# Patient Record
Sex: Male | Born: 1955 | Race: White | Hispanic: No | Marital: Single | Smoking: Current every day smoker
Health system: Southern US, Community
[De-identification: ages and names within clinical notes are randomized; demographics above are authoritative.]

## PROBLEM LIST (undated history)

## (undated) DIAGNOSIS — F1010 Alcohol abuse, uncomplicated: Secondary | ICD-10-CM

## (undated) DIAGNOSIS — Z720 Tobacco use: Secondary | ICD-10-CM

## (undated) DIAGNOSIS — F32A Depression, unspecified: Secondary | ICD-10-CM

---

## 2020-10-05 ENCOUNTER — Other Ambulatory Visit
Admission: RE | Admit: 2020-10-05 | Discharge: 2020-10-05 | Disposition: A | Discharge status: Home / Self Care | Payer: Self-pay | Source: Ambulatory Visit | Attending: Infectious Diseases | Admitting: Infectious Diseases

## 2020-10-05 DIAGNOSIS — M79671 Pain in right foot: Secondary | ICD-10-CM | POA: Insufficient documentation

## 2020-10-05 DIAGNOSIS — R600 Localized edema: Secondary | ICD-10-CM | POA: Insufficient documentation

## 2020-10-05 DIAGNOSIS — F32A Depression, unspecified: Secondary | ICD-10-CM | POA: Insufficient documentation

## 2020-10-05 DIAGNOSIS — R030 Elevated blood-pressure reading, without diagnosis of hypertension: Secondary | ICD-10-CM | POA: Insufficient documentation

## 2020-10-05 DIAGNOSIS — G8929 Other chronic pain: Secondary | ICD-10-CM | POA: Insufficient documentation

## 2020-10-05 LAB — BRAIN NATRIURETIC PEPTIDE: B Natriuretic Peptide: 95.3 pg/mL (ref 0.0–100.0)

## 2020-10-19 NOTE — Congregational Nurse Program (Signed)
  Dept: [phone]   Congregational Nurse Program Note  Date of Encounter: 10/19/2020 Client to clinic today for assistance with medical appointments. He now has a phone, phone number updated with [facility]. New appointment made today with podiatrist Dr. Alberteen Spindle for Wednesday Sept.28 at 10:15. Medicaid Transportation set up for this appointment, information given to client on a card. Support given. Past Medical History: No past medical history on file.  Encounter Details:

## 2021-03-22 ENCOUNTER — Encounter (HOSPITAL_COMMUNITY): Payer: Self-pay | Admitting: Physician Assistant

## 2021-03-22 ENCOUNTER — Other Ambulatory Visit: Payer: Self-pay

## 2021-03-22 ENCOUNTER — Emergency Department (HOSPITAL_COMMUNITY): Payer: Medicare Other

## 2021-03-22 ENCOUNTER — Inpatient Hospital Stay (HOSPITAL_COMMUNITY)
Admission: EM | Admit: 2021-03-22 | Discharge: 2021-03-27 | DRG: 189 | Disposition: A | Discharge status: Home / Self Care | Payer: Medicare Other | Attending: Internal Medicine | Admitting: Internal Medicine

## 2021-03-22 DIAGNOSIS — D72829 Elevated white blood cell count, unspecified: Secondary | ICD-10-CM

## 2021-03-22 DIAGNOSIS — I4892 Unspecified atrial flutter: Secondary | ICD-10-CM

## 2021-03-22 DIAGNOSIS — Z77098 Contact with and (suspected) exposure to other hazardous, chiefly nonmedicinal, chemicals: Secondary | ICD-10-CM | POA: Diagnosis present

## 2021-03-22 DIAGNOSIS — R6510 Systemic inflammatory response syndrome (SIRS) of non-infectious origin without acute organ dysfunction: Secondary | ICD-10-CM

## 2021-03-22 DIAGNOSIS — F32A Depression, unspecified: Secondary | ICD-10-CM | POA: Diagnosis present

## 2021-03-22 DIAGNOSIS — Z5900 Homelessness unspecified: Secondary | ICD-10-CM

## 2021-03-22 DIAGNOSIS — E872 Acidosis, unspecified: Secondary | ICD-10-CM | POA: Diagnosis present

## 2021-03-22 DIAGNOSIS — J209 Acute bronchitis, unspecified: Secondary | ICD-10-CM | POA: Insufficient documentation

## 2021-03-22 DIAGNOSIS — D649 Anemia, unspecified: Secondary | ICD-10-CM | POA: Diagnosis present

## 2021-03-22 DIAGNOSIS — R778 Other specified abnormalities of plasma proteins: Secondary | ICD-10-CM

## 2021-03-22 DIAGNOSIS — F1010 Alcohol abuse, uncomplicated: Secondary | ICD-10-CM

## 2021-03-22 DIAGNOSIS — D75839 Thrombocytosis, unspecified: Secondary | ICD-10-CM | POA: Diagnosis present

## 2021-03-22 DIAGNOSIS — Z885 Allergy status to narcotic agent status: Secondary | ICD-10-CM

## 2021-03-22 DIAGNOSIS — J9601 Acute respiratory failure with hypoxia: Principal | ICD-10-CM

## 2021-03-22 DIAGNOSIS — Z720 Tobacco use: Secondary | ICD-10-CM

## 2021-03-22 DIAGNOSIS — J441 Chronic obstructive pulmonary disease with (acute) exacerbation: Secondary | ICD-10-CM | POA: Diagnosis present

## 2021-03-22 DIAGNOSIS — G8929 Other chronic pain: Secondary | ICD-10-CM | POA: Diagnosis present

## 2021-03-22 DIAGNOSIS — Z20822 Contact with and (suspected) exposure to covid-19: Secondary | ICD-10-CM | POA: Diagnosis present

## 2021-03-22 DIAGNOSIS — R7989 Other specified abnormal findings of blood chemistry: Secondary | ICD-10-CM

## 2021-03-22 DIAGNOSIS — I483 Typical atrial flutter: Secondary | ICD-10-CM | POA: Diagnosis present

## 2021-03-22 DIAGNOSIS — I4510 Unspecified right bundle-branch block: Secondary | ICD-10-CM | POA: Diagnosis present

## 2021-03-22 DIAGNOSIS — Z8249 Family history of ischemic heart disease and other diseases of the circulatory system: Secondary | ICD-10-CM

## 2021-03-22 DIAGNOSIS — F17210 Nicotine dependence, cigarettes, uncomplicated: Secondary | ICD-10-CM | POA: Diagnosis present

## 2021-03-22 DIAGNOSIS — I959 Hypotension, unspecified: Secondary | ICD-10-CM | POA: Diagnosis present

## 2021-03-22 DIAGNOSIS — F1410 Cocaine abuse, uncomplicated: Secondary | ICD-10-CM | POA: Diagnosis present

## 2021-03-22 DIAGNOSIS — R0602 Shortness of breath: Secondary | ICD-10-CM

## 2021-03-22 DIAGNOSIS — Z716 Tobacco abuse counseling: Secondary | ICD-10-CM

## 2021-03-22 DIAGNOSIS — A419 Sepsis, unspecified organism: Secondary | ICD-10-CM

## 2021-03-22 HISTORY — DX: Depression, unspecified: F32.A

## 2021-03-22 HISTORY — DX: Tobacco use: Z72.0

## 2021-03-22 HISTORY — DX: Alcohol abuse, uncomplicated: F10.10

## 2021-03-22 LAB — PROTIME-INR
INR: 1.1 (ref 0.8–1.2)
Prothrombin Time: 13.9 seconds (ref 11.4–15.2)

## 2021-03-22 LAB — URINALYSIS, ROUTINE W REFLEX MICROSCOPIC
Bilirubin Urine: NEGATIVE
Glucose, UA: NEGATIVE mg/dL
Hgb urine dipstick: NEGATIVE
Ketones, ur: 5 mg/dL — AB
Leukocytes,Ua: NEGATIVE
Nitrite: NEGATIVE
Protein, ur: NEGATIVE mg/dL
Specific Gravity, Urine: 1.01 (ref 1.005–1.030)
pH: 5 (ref 5.0–8.0)

## 2021-03-22 LAB — CBC WITH DIFFERENTIAL/PLATELET
Abs Immature Granulocytes: 0.15 10*3/uL — ABNORMAL HIGH (ref 0.00–0.07)
Basophils Absolute: 0.1 10*3/uL (ref 0.0–0.1)
Basophils Relative: 0 %
Eosinophils Absolute: 0 10*3/uL (ref 0.0–0.5)
Eosinophils Relative: 0 %
HCT: 51.5 % (ref 39.0–52.0)
Hemoglobin: 17.4 g/dL — ABNORMAL HIGH (ref 13.0–17.0)
Immature Granulocytes: 1 %
Lymphocytes Relative: 12 %
Lymphs Abs: 2.6 10*3/uL (ref 0.7–4.0)
MCH: 33.8 pg (ref 26.0–34.0)
MCHC: 33.8 g/dL (ref 30.0–36.0)
MCV: 100 fL (ref 80.0–100.0)
Monocytes Absolute: 1.5 10*3/uL — ABNORMAL HIGH (ref 0.1–1.0)
Monocytes Relative: 7 %
Neutro Abs: 18.1 10*3/uL — ABNORMAL HIGH (ref 1.7–7.7)
Neutrophils Relative %: 80 %
Platelets: 356 10*3/uL (ref 150–400)
RBC: 5.15 MIL/uL (ref 4.22–5.81)
RDW: 12.5 % (ref 11.5–15.5)
WBC: 22.4 10*3/uL — ABNORMAL HIGH (ref 4.0–10.5)
nRBC: 0 % (ref 0.0–0.2)

## 2021-03-22 LAB — COMPREHENSIVE METABOLIC PANEL
ALT: 22 U/L (ref 0–44)
AST: 26 U/L (ref 15–41)
Albumin: 3.4 g/dL — ABNORMAL LOW (ref 3.5–5.0)
Alkaline Phosphatase: 75 U/L (ref 38–126)
Anion gap: 13 (ref 5–15)
BUN: 17 mg/dL (ref 8–23)
CO2: 23 mmol/L (ref 22–32)
Calcium: 9 mg/dL (ref 8.9–10.3)
Chloride: 101 mmol/L (ref 98–111)
Creatinine, Ser: 1.18 mg/dL (ref 0.61–1.24)
GFR, Estimated: >60 mL/min (ref >60)
Glucose, Bld: 114 mg/dL — ABNORMAL HIGH (ref 70–99)
Potassium: 3.8 mmol/L (ref 3.5–5.1)
Sodium: 137 mmol/L (ref 135–145)
Total Bilirubin: 1.5 mg/dL — ABNORMAL HIGH (ref 0.3–1.2)
Total Protein: 6.8 g/dL (ref 6.5–8.1)

## 2021-03-22 LAB — LACTIC ACID, PLASMA
Lactic Acid, Venous: 2.3 mmol/L (ref 0.5–1.9)
Lactic Acid, Venous: 1.8 mmol/L (ref 0.5–1.9)

## 2021-03-22 LAB — RESP PANEL BY RT-PCR (FLU A&B, COVID) ARPGX2
Influenza A by PCR: NEGATIVE
Influenza B by PCR: NEGATIVE
SARS Coronavirus 2 by RT PCR: NEGATIVE

## 2021-03-22 LAB — HIV ANTIBODY (ROUTINE TESTING W REFLEX): HIV Screen 4th Generation wRfx: NONREACTIVE

## 2021-03-22 LAB — ETHANOL: Alcohol, Ethyl (B): <10 mg/dL (ref <10)

## 2021-03-22 LAB — TROPONIN I (HIGH SENSITIVITY)
Troponin I (High Sensitivity): 18 ng/L — ABNORMAL HIGH (ref <18)
Troponin I (High Sensitivity): 13 ng/L (ref <18)

## 2021-03-22 LAB — RAPID URINE DRUG SCREEN, HOSP PERFORMED
Amphetamines: NOT DETECTED
Barbiturates: NOT DETECTED
Benzodiazepines: NOT DETECTED
Cocaine: POSITIVE — AB
Opiates: NOT DETECTED
Tetrahydrocannabinol: NOT DETECTED

## 2021-03-22 LAB — MAGNESIUM: Magnesium: 2 mg/dL (ref 1.7–2.4)

## 2021-03-22 LAB — I-STAT CHEM 8, ED
BUN: 22 mg/dL (ref 8–23)
Calcium, Ion: 1.11 mmol/L — ABNORMAL LOW (ref 1.15–1.40)
Chloride: 102 mmol/L (ref 98–111)
Creatinine, Ser: 1.2 mg/dL (ref 0.61–1.24)
Glucose, Bld: 114 mg/dL — ABNORMAL HIGH (ref 70–99)
HCT: 53 % — ABNORMAL HIGH (ref 39.0–52.0)
Hemoglobin: 18 g/dL — ABNORMAL HIGH (ref 13.0–17.0)
Potassium: 4.1 mmol/L (ref 3.5–5.1)
Sodium: 137 mmol/L (ref 135–145)
TCO2: 26 mmol/L (ref 22–32)

## 2021-03-22 LAB — APTT: aPTT: 29 seconds (ref 24–36)

## 2021-03-22 LAB — PROCALCITONIN: Procalcitonin: 0.15 ng/mL

## 2021-03-22 LAB — TSH: TSH: 0.413 u[IU]/mL (ref 0.350–4.500)

## 2021-03-22 LAB — BRAIN NATRIURETIC PEPTIDE: B Natriuretic Peptide: 242.2 pg/mL — ABNORMAL HIGH (ref 0.0–100.0)

## 2021-03-22 LAB — LIPASE, BLOOD: Lipase: 33 U/L (ref 11–51)

## 2021-03-22 MED ORDER — SODIUM CHLORIDE 0.9 % IV SOLN
2.0000 g | INTRAVENOUS | Status: AC
  Administered 2021-03-22 – 2021-03-26 (×5): 2 g via INTRAVENOUS
  Filled 2021-03-22 (×5): qty 20

## 2021-03-22 MED ORDER — AMIODARONE HCL IN DEXTROSE 360-4.14 MG/200ML-% IV SOLN
30.0000 mg/h | INTRAVENOUS | Status: DC
  Administered 2021-03-23 (×3): 30 mg/h via INTRAVENOUS
  Filled 2021-03-22 (×3): qty 200

## 2021-03-22 MED ORDER — AMIODARONE HCL IN DEXTROSE 360-4.14 MG/200ML-% IV SOLN
60.0000 mg/h | INTRAVENOUS | Status: AC
  Administered 2021-03-22 (×2): 60 mg/h via INTRAVENOUS
  Filled 2021-03-22 (×2): qty 200

## 2021-03-22 MED ORDER — SODIUM CHLORIDE 0.9 % IV SOLN
500.0000 mg | INTRAVENOUS | Status: DC
  Filled 2021-03-22: qty 5

## 2021-03-22 MED ORDER — BUDESONIDE 0.5 MG/2ML IN SUSP
0.5000 mg | Freq: Two times a day (BID) | RESPIRATORY_TRACT | Status: DC
  Administered 2021-03-23 – 2021-03-26 (×8): 0.5 mg via RESPIRATORY_TRACT
  Filled 2021-03-22 (×9): qty 2

## 2021-03-22 MED ORDER — DOXYCYCLINE HYCLATE 100 MG PO TABS
100.0000 mg | ORAL_TABLET | Freq: Two times a day (BID) | ORAL | Status: DC
Start: 2021-03-22 — End: 2021-03-27
  Administered 2021-03-22 – 2021-03-27 (×10): 100 mg via ORAL
  Filled 2021-03-22 (×10): qty 1

## 2021-03-22 MED ORDER — LACTATED RINGERS IV SOLN: INTRAVENOUS | Status: AC

## 2021-03-22 MED ORDER — HEPARIN BOLUS VIA INFUSION
4000.0000 [IU] | Freq: Once | INTRAVENOUS | Status: AC
  Administered 2021-03-22: 4000 [IU] via INTRAVENOUS
  Filled 2021-03-22: qty 4000

## 2021-03-22 MED ORDER — DOXYCYCLINE HYCLATE 100 MG PO TABS
100.0000 mg | ORAL_TABLET | Freq: Once | ORAL | Status: DC
Start: 2021-03-22 — End: 2021-03-22

## 2021-03-22 MED ORDER — AMIODARONE LOAD VIA INFUSION
150.0000 mg | Freq: Once | INTRAVENOUS | Status: AC
  Administered 2021-03-22: 150 mg via INTRAVENOUS
  Filled 2021-03-22: qty 83.34

## 2021-03-22 MED ORDER — SODIUM CHLORIDE 0.9 % IV SOLN: 2.0000 g | INTRAVENOUS | Status: DC

## 2021-03-22 MED ORDER — IPRATROPIUM BROMIDE HFA 17 MCG/ACT IN AERS: 2.0000 | INHALATION_SPRAY | RESPIRATORY_TRACT | Status: DC

## 2021-03-22 MED ORDER — ACETAMINOPHEN 325 MG PO TABS
650.0000 mg | ORAL_TABLET | ORAL | Status: DC | PRN
  Administered 2021-03-23 – 2021-03-26 (×5): 650 mg via ORAL
  Filled 2021-03-22 (×5): qty 2

## 2021-03-22 MED ORDER — ONDANSETRON HCL 4 MG/2ML IJ SOLN: 4.0000 mg | Freq: Four times a day (QID) | INTRAMUSCULAR | Status: DC | PRN

## 2021-03-22 MED ORDER — HEPARIN (PORCINE) 25000 UT/250ML-% IV SOLN
1800.0000 [IU]/h | INTRAVENOUS | Status: AC
  Administered 2021-03-22: 21:00:00 1200 [IU]/h via INTRAVENOUS
  Administered 2021-03-23: 1400 [IU]/h via INTRAVENOUS
  Administered 2021-03-24: 1800 [IU]/h via INTRAVENOUS
  Filled 2021-03-22 (×3): qty 250

## 2021-03-22 MED ORDER — IPRATROPIUM BROMIDE 0.02 % IN SOLN
0.5000 mg | RESPIRATORY_TRACT | Status: DC
  Administered 2021-03-22 – 2021-03-23 (×2): 0.5 mg via RESPIRATORY_TRACT
  Filled 2021-03-22 (×3): qty 2.5

## 2021-03-22 MED ORDER — LACTATED RINGERS IV BOLUS
1000.0000 mL | Freq: Once | INTRAVENOUS | Status: AC
  Administered 2021-03-22: 1000 mL via INTRAVENOUS

## 2021-03-22 MED ORDER — LACTATED RINGERS IV BOLUS (SEPSIS)
500.0000 mL | Freq: Once | INTRAVENOUS | Status: AC
  Administered 2021-03-22: 500 mL via INTRAVENOUS
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
## 2021-03-22 NOTE — Consult Note (Signed)
NAME:  Jack Barber, MRN:  [id number], DOB:  21-Jun-1955, LOS: 0 ADMISSION DATE:  03/22/2021, CONSULTATION DATE: March 22, 2021 REFERRING MD: ED staff, CHIEF COMPLAINT: Shortness of breath patient is 66 year old male with history of alcohol abuse smoking does not use oxygen who is here with 2 to 3 days history of shortness of breath came and was found to have A-fib RVR a flutter was put on amnio drip he was hypotensive initially when I am talking to him he is telling me that his breathing is much better now since the treatment started we were called to assess for an ICU admission patient is denying any chest pain and using the oxygen in the past now he is on 6 L.   Objective   Blood pressure 117/81, pulse (!) 144, temperature 98.2 F (36.8 C), temperature source Oral, resp. rate (!) 32, height 5\' 8"  (1.727 m), weight 81.6 kg, SpO2 94 %.       No intake or output data in the 24 hours ending 03/22/21 1955 Filed Weights   03/22/21 1649  Weight: 81.6 kg    Examination: General:  NAD , pleasant and hungry , wants to eat . Neuro:  WNL , AOX3 , EOMI , CN II-XII intact , UL , LL strength is symmetrical and 5/5 HEENT:  atraumatic , no jaundice , dry mucous membranes  Cardiovascular:  Irregular irregular , ESM 2/6 in the aortic area  Lungs:  CTA bilateral , no wheezing or crackles  Abdomen:  Soft lax +BS , no tenderness . Musculoskeletal:  WNL , normal pulses  Skin:  No rash     Assessment & Plan:    --Shortness of breath multifactorial history of smoking possible COPD acute bronchitis and a flutter with RVR start rate control patient blood pressure improved with that we will add DuoNebs and Pulmicort or Atrovent and Pulmicort avoid beta 2 agonist with the severe tachycardia  -- Hypotension with lactic acid possible septic possibly from the heart rate he is started on antibiotics continue with that till cultures are negative check procalcitonin   --Empiric treatment for alcohol  withdrawal nicotine patches for smoking patient is somewhat stable at this point no hypotensive no indication for pressors he is on amnio drip we will monitor but no clear indication to admit him to the ICU.  --Thrombocytosis could be from secondary hypoxemia or smoking or dehydration continue with gentle hydration.  Labs   CBC: Recent Labs  Lab 03/22/21 1717 03/22/21 1732  WBC 22.4*  --   NEUTROABS 18.1*  --   HGB 17.4* 18.0*  HCT 51.5 53.0*  MCV 100.0  --   PLT 356  --     Basic Metabolic Panel: Recent Labs  Lab 03/22/21 1717 03/22/21 1732  NA 137 137  K 3.8 4.1  CL 101 102  CO2 23  --   GLUCOSE 114* 114*  BUN 17 22  CREATININE 1.18 1.20  CALCIUM 9.0  --   MG 2.0  --    GFR: Estimated Creatinine Clearance: 58.6 mL/min (by C-G formula based on SCr of 1.2 mg/dL). Recent Labs  Lab 03/22/21 1717  WBC 22.4*  LATICACIDVEN 2.3*    Liver Function Tests: Recent Labs  Lab 03/22/21 1717  AST 26  ALT 22  ALKPHOS 75  BILITOT 1.5*  PROT 6.8  ALBUMIN 3.4*   Recent Labs  Lab 03/22/21 1717  LIPASE 33   No results for input(s): AMMONIA in the last 168 hours.  ABG    Component Value Date/Time   TCO2 26 03/22/2021 1732     Coagulation Profile: Recent Labs  Lab 03/22/21 1717  INR 1.1    Cardiac Enzymes: No results for input(s): CKTOTAL, CKMB, CKMBINDEX, TROPONINI in the last 168 hours.  HbA1C: No results found for: HGBA1C  CBG: No results for input(s): GLUCAP in the last 168 hours.  Review of Systems:   What mentioned HPI patient had no headache no passing out  Past Medical History:  He,  has a past medical history of Alcohol abuse and Tobacco abuse.   Surgical History:   Social History:   reports that he has been smoking cigarettes. He does not have any smokeless tobacco history on file. He reports current alcohol use.   Family History:  His family history includes Heart attack in his brother and father.   Allergies Allergies  Allergen  Reactions   Codeine Itching     Home Medications  Prior to Admission medications   Not on File
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
## 2021-03-22 NOTE — ED Triage Notes (Signed)
Pt bib [facility] from home c/o cough, fever, chills, and SOB x4 days. Pt states he feels like hes drowning when he lays down. Pt has no hx and meds because he "does not believe in that nor can I afford to believe in that." EMS gave 1 albuterol neb, 125mg  of solumedrol, and 1L of NS. Pt has not drank alcohol in 4 days. Pt is also a smoker.   EMS vitals 104/60 BP 140-150 HR 95% 6L [place] 28 RR 176 CBG

## 2021-03-22 NOTE — Consult Note (Addendum)
Cardiology Consultation:   Patient ID: Jack Barber MRN: [id number]; DOB: 1955/04/17  Admit date: 03/22/2021 Date of Consult: 03/22/2021  PCP:  No primary care provider on file.   [facility] Providers Cardiologist:  New   Patient Profile:   Jack Barber is a 66 y.o. male with a hx of tobacco and alcohol abuse who is being seen 03/22/2021 for the evaluation of atrial flutter at the request of Dr. Denton Lank.  History of Present Illness:   Mr. Mazariego has no regular follow-up or checkup.  He does not take care of himself.  No work since pandemic.  He smokes 1 pack of tobacco for past 21 years.  Drinks alcohol few days per week.  Denies illicit drug use.  Reports history of CAD to brother and father in their 82s.  He reported 4 to 5 days history of "fluid in his chest".  Hot and cold feeling.  He was short of breath but no chest pain.  Denies lower extremity edema, palpitation or syncope.  Had dizziness with coughing spell.  Due to worsening breathing EMS was called and given 125 mg of Solu-Medrol, nebulizer and 1 L of normal saline.  He was noted to have atrial flutter with RVR.  Plan to start IV amiodarone.  Cardiology is asked for further evaluation.  Oxygenation 95% on 6 L nasal cannula.  Labs are pending.   Past Medical History:  Diagnosis Date   Alcohol abuse    Tobacco abuse      Inpatient Medications: Scheduled Meds:  Continuous Infusions:  amiodarone     Followed by   amiodarone     cefTRIAXone (ROCEPHIN)  IV 2 g (03/22/21 1751)   lactated ringers 500 mL (03/22/21 1751)   lactated ringers     PRN Meds:   Allergies:    Allergies  Allergen Reactions   Codeine Itching    Social History:   Social History   Socioeconomic History   Marital status: Single    Spouse name: Not on file   Number of children: Not on file   Years of education: Not on file   Highest education level: Not on file  Occupational History   Not on file  Tobacco Use    Smoking status: Every Day    Types: Cigarettes   Smokeless tobacco: Not on file  Substance and Sexual Activity   Alcohol use: Yes   Drug use: Not on file   Sexual activity: Not on file  Other Topics Concern   Not on file  Social History Narrative   Not on file   Social Determinants of Health   Financial Resource Strain: Not on file  Food Insecurity: Not on file  Transportation Needs: Not on file  Physical Activity: Not on file  Stress: Not on file  Social Connections: Not on file  Intimate Partner Violence: Not on file    Family History:   Family History  Problem Relation Age of Onset   Heart attack Father    Heart attack Brother      ROS:  Please see the history of present illness.  All other ROS reviewed and negative.     Physical Exam/Data:   Vitals:   03/22/21 1645 03/22/21 1649 03/22/21 1700 03/22/21 1745  BP: (!) 105/94  100/89 101/86  Pulse: (!) 156  75 (!) 155  Resp: (!) 35  (!) 40 (!) 28  Temp:      TempSrc:      SpO2: 96%  96% 95%  Weight:  81.6 kg    Height:  5\' 8"  (1.727 m)     No intake or output data in the 24 hours ending 03/22/21 1801 Last 3 Weights 03/22/2021  Weight (lbs) 180 lb  Weight (kg) 81.647 kg     Body mass index is 27.37 kg/m.  General: Ill-appearing male in no acute distress  HEENT: normal Neck: JVD difficult to assess due to beard Vascular: No carotid bruits; Distal pulses 2+ bilaterally Cardiac:  normal S1, S2; RRR; no murmur  Lungs:  clear to auscultation bilaterally, no wheezing, rhonchi or rales  Abd: soft, nontender, no hepatomegaly  Ext: no edema Musculoskeletal:  No deformities, BUE and BLE strength normal and equal Skin: warm and dry  Neuro:  CNs 2-12 intact, no focal abnormalities noted Psych:  Normal affect   EKG:  The EKG was personally reviewed and demonstrates: 2:1 atrial flutter Telemetry:  Telemetry was personally reviewed and demonstrates: Atrial flutter with heart rate in 150s  Relevant CV  Studies: N/A  Laboratory Data:  High Sensitivity Troponin:  No results for input(s): TROPONINIHS in the last 720 hours.   Chemistry Recent Labs  Lab 03/22/21 1732  NA 137  K 4.1  CL 102  GLUCOSE 114*  BUN 22  CREATININE 1.20     Hematology Recent Labs  Lab 03/22/21 1717 03/22/21 1732  WBC 22.4*  --   RBC 5.15  --   HGB 17.4* 18.0*  HCT 51.5 53.0*  MCV 100.0  --   MCH 33.8  --   MCHC 33.8  --   RDW 12.5  --   PLT 356  --    Radiology/Studies:  DG Chest Port 1 View  Result Date: 03/22/2021 CLINICAL DATA:  Short of breath EXAM: PORTABLE CHEST 1 VIEW COMPARISON:  None. FINDINGS: The heart size and mediastinal contours are within normal limits. Both lungs are clear. The visualized skeletal structures are unremarkable. IMPRESSION: No active disease. Electronically Signed   By: 03/24/2021 M.D.   On: 03/22/2021 17:19     Assessment and Plan:   New onset atrial flutter with rapid ventricular rate -In setting of suspected sepsis of unknown origin.  Labs are pending. -Agree with IV amiodarone -Get echocardiogram -03/24/2021 Vascor of 1 for hypertension.  Consider IV heparin in case needs TEE cardioversion.  Patient is unaware of palpitations.  Check TSH.  Patient is not in heart failure.    Risk Assessment/Risk Scores:    CHA2DS2-VASc Score = 1   This indicates a 0.6% annual risk of stroke. The patient's score is based upon: CHF History: 0 HTN History: 0 Diabetes History: 0 Stroke History: 0 Vascular Disease History: 0 Age Score: 1 Gender Score: 0     For questions or updates, please contact [facility] Please consult [email or web address] for contact info under    [place], PA  03/22/2021 6:01 PM

## 2021-03-22 NOTE — Progress Notes (Signed)
ANTICOAGULATION CONSULT NOTE - Initial Consult  Pharmacy Consult for Heparin Indication: atrial fibrillation  Allergies  Allergen Reactions   Codeine Itching    Patient Measurements: Height: 5\' 8"  (172.7 cm) Weight: 81.6 kg (180 lb) IBW/kg (Calculated) : 68.4 Heparin Dosing Weight: 81.6 kg  Vital Signs: Temp: 98.2 F (36.8 C) (02/23 1633) Temp Source: Oral (02/23 1633) BP: 117/81 (02/23 1915) Pulse Rate: 144 (02/23 1915)  Labs: Recent Labs    03/22/21 1717 03/22/21 1732  HGB 17.4* 18.0*  HCT 51.5 53.0*  PLT 356  --   APTT 29  --   LABPROT 13.9  --   INR 1.1  --   CREATININE 1.18 1.20  TROPONINIHS 18*  --     Estimated Creatinine Clearance: 58.6 mL/min (by C-G formula based on SCr of 1.2 mg/dL).   Medical History: Past Medical History:  Diagnosis Date   Alcohol abuse    Tobacco abuse     Medications:  (Not in a hospital admission)  Scheduled:  Infusions:   amiodarone 60 mg/hr (03/22/21 1748)   Followed by   amiodarone     cefTRIAXone (ROCEPHIN)  IV Stopped (03/22/21 1839)   lactated ringers 150 mL/hr at 03/22/21 1840   PRN:   Assessment: 38 yom with a history of tobacco and alcohol use. Patient is presenting with SOB. Heparin per pharmacy consult placed for atrial fibrillation.  Patient is not on anticoagulation prior to arrival.  Hgb 18; plt 356  Goal of Therapy:  Heparin level 0.3-0.7 units/ml Monitor platelets by anticoagulation protocol: Yes   Plan:  Give 4000 units bolus x 1 Start heparin infusion at 1200 units/hr Check anti-Xa level in 6 hours and daily while on heparin Continue to monitor H&H and platelets  Lorelei Pont, PharmD, BCPS 03/22/2021 7:28 PM ED Clinical Pharmacist -  [phone]

## 2021-03-22 NOTE — Sepsis Progress Note (Signed)
Code Sepsis protocol being monitored by eLink. 

## 2021-03-22 NOTE — Sepsis Progress Note (Signed)
Initial Lactic acid 2.3. Pt seen by cardiology. Pt had 1L IVF with EMS and 518ml LR here. Additional fluid bolus is being ordered by PA.

## 2021-03-23 ENCOUNTER — Observation Stay (HOSPITAL_COMMUNITY): Payer: Medicare Other

## 2021-03-23 ENCOUNTER — Other Ambulatory Visit (HOSPITAL_COMMUNITY): Payer: Self-pay

## 2021-03-23 DIAGNOSIS — I4892 Unspecified atrial flutter: Secondary | ICD-10-CM

## 2021-03-23 DIAGNOSIS — F17210 Nicotine dependence, cigarettes, uncomplicated: Secondary | ICD-10-CM | POA: Diagnosis present

## 2021-03-23 DIAGNOSIS — Z885 Allergy status to narcotic agent status: Secondary | ICD-10-CM | POA: Diagnosis not present

## 2021-03-23 DIAGNOSIS — Z20822 Contact with and (suspected) exposure to covid-19: Secondary | ICD-10-CM | POA: Diagnosis present

## 2021-03-23 DIAGNOSIS — Z77098 Contact with and (suspected) exposure to other hazardous, chiefly nonmedicinal, chemicals: Secondary | ICD-10-CM | POA: Diagnosis present

## 2021-03-23 DIAGNOSIS — R7989 Other specified abnormal findings of blood chemistry: Secondary | ICD-10-CM | POA: Diagnosis not present

## 2021-03-23 DIAGNOSIS — D72829 Elevated white blood cell count, unspecified: Secondary | ICD-10-CM | POA: Diagnosis not present

## 2021-03-23 DIAGNOSIS — I4891 Unspecified atrial fibrillation: Secondary | ICD-10-CM

## 2021-03-23 DIAGNOSIS — E872 Acidosis, unspecified: Secondary | ICD-10-CM | POA: Diagnosis present

## 2021-03-23 DIAGNOSIS — I959 Hypotension, unspecified: Secondary | ICD-10-CM | POA: Diagnosis present

## 2021-03-23 DIAGNOSIS — J441 Chronic obstructive pulmonary disease with (acute) exacerbation: Secondary | ICD-10-CM | POA: Diagnosis present

## 2021-03-23 DIAGNOSIS — F1410 Cocaine abuse, uncomplicated: Secondary | ICD-10-CM | POA: Diagnosis present

## 2021-03-23 DIAGNOSIS — I483 Typical atrial flutter: Secondary | ICD-10-CM | POA: Diagnosis present

## 2021-03-23 DIAGNOSIS — I4510 Unspecified right bundle-branch block: Secondary | ICD-10-CM | POA: Diagnosis present

## 2021-03-23 DIAGNOSIS — Z716 Tobacco abuse counseling: Secondary | ICD-10-CM | POA: Diagnosis not present

## 2021-03-23 DIAGNOSIS — Z720 Tobacco use: Secondary | ICD-10-CM | POA: Diagnosis not present

## 2021-03-23 DIAGNOSIS — F1010 Alcohol abuse, uncomplicated: Secondary | ICD-10-CM | POA: Diagnosis not present

## 2021-03-23 DIAGNOSIS — F32A Depression, unspecified: Secondary | ICD-10-CM | POA: Diagnosis present

## 2021-03-23 DIAGNOSIS — D649 Anemia, unspecified: Secondary | ICD-10-CM | POA: Diagnosis present

## 2021-03-23 DIAGNOSIS — J9601 Acute respiratory failure with hypoxia: Secondary | ICD-10-CM | POA: Diagnosis present

## 2021-03-23 DIAGNOSIS — Z5900 Homelessness unspecified: Secondary | ICD-10-CM | POA: Diagnosis not present

## 2021-03-23 DIAGNOSIS — Z8249 Family history of ischemic heart disease and other diseases of the circulatory system: Secondary | ICD-10-CM | POA: Diagnosis not present

## 2021-03-23 DIAGNOSIS — G8929 Other chronic pain: Secondary | ICD-10-CM | POA: Diagnosis present

## 2021-03-23 DIAGNOSIS — D75839 Thrombocytosis, unspecified: Secondary | ICD-10-CM | POA: Diagnosis present

## 2021-03-23 DIAGNOSIS — R6510 Systemic inflammatory response syndrome (SIRS) of non-infectious origin without acute organ dysfunction: Secondary | ICD-10-CM | POA: Diagnosis not present

## 2021-03-23 LAB — ECHOCARDIOGRAM COMPLETE
AR max vel: 1.49 cm2
AV Peak grad: 10.5 mmHg
Ao pk vel: 1.62 m/s
Area-P 1/2: 5.84 cm2
Calc EF: 48.1 %
Height: 68 in
S' Lateral: 3.2 cm
Single Plane A2C EF: 48 %
Single Plane A4C EF: 48.9 %
Weight: 2864.22 oz

## 2021-03-23 LAB — CBC
HCT: 39.8 % (ref 39.0–52.0)
Hemoglobin: 13.3 g/dL (ref 13.0–17.0)
MCH: 32.8 pg (ref 26.0–34.0)
MCHC: 33.4 g/dL (ref 30.0–36.0)
MCV: 98 fL (ref 80.0–100.0)
Platelets: 313 10*3/uL (ref 150–400)
RBC: 4.06 MIL/uL — ABNORMAL LOW (ref 4.22–5.81)
RDW: 12.4 % (ref 11.5–15.5)
WBC: 18.8 10*3/uL — ABNORMAL HIGH (ref 4.0–10.5)
nRBC: 0 % (ref 0.0–0.2)

## 2021-03-23 LAB — BASIC METABOLIC PANEL
Anion gap: 8 (ref 5–15)
BUN: 20 mg/dL (ref 8–23)
CO2: 22 mmol/L (ref 22–32)
Calcium: 8.6 mg/dL — ABNORMAL LOW (ref 8.9–10.3)
Chloride: 106 mmol/L (ref 98–111)
Creatinine, Ser: 1.08 mg/dL (ref 0.61–1.24)
GFR, Estimated: >60 mL/min (ref >60)
Glucose, Bld: 201 mg/dL — ABNORMAL HIGH (ref 70–99)
Potassium: 4.2 mmol/L (ref 3.5–5.1)
Sodium: 136 mmol/L (ref 135–145)

## 2021-03-23 LAB — LIPID PANEL
Cholesterol: 100 mg/dL (ref 0–200)
HDL: 22 mg/dL — ABNORMAL LOW (ref >40)
LDL Cholesterol: 67 mg/dL (ref 0–99)
Total CHOL/HDL Ratio: 4.5 RATIO
Triglycerides: 57 mg/dL (ref <150)
VLDL: 11 mg/dL (ref 0–40)

## 2021-03-23 LAB — HEPARIN LEVEL (UNFRACTIONATED)
Heparin Unfractionated: <0.1 IU/mL — ABNORMAL LOW (ref 0.30–0.70)
Heparin Unfractionated: <0.1 IU/mL — ABNORMAL LOW (ref 0.30–0.70)
Heparin Unfractionated: 0.17 IU/mL — ABNORMAL LOW (ref 0.30–0.70)

## 2021-03-23 LAB — PROCALCITONIN: Procalcitonin: 0.13 ng/mL

## 2021-03-23 LAB — MRSA NEXT GEN BY PCR, NASAL: MRSA by PCR Next Gen: NOT DETECTED

## 2021-03-23 MED ORDER — HEPARIN BOLUS VIA INFUSION
3000.0000 [IU] | Freq: Once | INTRAVENOUS | Status: AC
  Administered 2021-03-23: 3000 [IU] via INTRAVENOUS
  Filled 2021-03-23: qty 3000

## 2021-03-23 MED ORDER — PREDNISONE 20 MG PO TABS
20.0000 mg | ORAL_TABLET | Freq: Every day | ORAL | Status: DC
  Administered 2021-03-24 – 2021-03-26 (×3): 20 mg via ORAL
  Filled 2021-03-23 (×4): qty 1

## 2021-03-23 MED ORDER — IPRATROPIUM BROMIDE 0.02 % IN SOLN
0.5000 mg | Freq: Three times a day (TID) | RESPIRATORY_TRACT | Status: DC
  Administered 2021-03-23: 0.5 mg via RESPIRATORY_TRACT
  Filled 2021-03-23: qty 2.5

## 2021-03-23 MED ORDER — ARFORMOTEROL TARTRATE 15 MCG/2ML IN NEBU
15.0000 ug | INHALATION_SOLUTION | Freq: Two times a day (BID) | RESPIRATORY_TRACT | Status: DC
  Administered 2021-03-23 – 2021-03-26 (×8): 15 ug via RESPIRATORY_TRACT
  Filled 2021-03-23 (×9): qty 2

## 2021-03-23 MED ORDER — HEPARIN BOLUS VIA INFUSION
2000.0000 [IU] | Freq: Once | INTRAVENOUS | Status: AC
  Administered 2021-03-23: 2000 [IU] via INTRAVENOUS
  Filled 2021-03-23: qty 2000

## 2021-03-23 MED ORDER — ALBUTEROL SULFATE (2.5 MG/3ML) 0.083% IN NEBU: 2.5000 mg | INHALATION_SOLUTION | RESPIRATORY_TRACT | Status: DC | PRN
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
## 2021-03-23 NOTE — Progress Notes (Signed)
ANTICOAGULATION CONSULT NOTE   Pharmacy Consult for Heparin Indication: atrial fibrillation  Allergies  Allergen Reactions   Codeine Itching    Patient Measurements: Height: 5\' 8"  (172.7 cm) Weight: 81.2 kg (179 lb 0.2 oz) IBW/kg (Calculated) : 68.4 Heparin Dosing Weight: 81.6 kg  Vital Signs: Temp: 98.3 F (36.8 C) (02/24 1035) Temp Source: Oral (02/24 1034) BP: 123/62 (02/24 1100) Pulse Rate: 63 (02/24 1100)  Labs: Recent Labs    03/22/21 1717 03/22/21 1732 03/22/21 2046 03/23/21 0527 03/23/21 1447  HGB 17.4* 18.0*  --  13.3  --   HCT 51.5 53.0*  --  39.8  --   PLT 356  --   --  313  --   APTT 29  --   --   --   --   LABPROT 13.9  --   --   --   --   INR 1.1  --   --   --   --   HEPARINUNFRC  --   --   --  <0.10* <0.10*  CREATININE 1.18 1.20  --  1.08  --   TROPONINIHS 18*  --  13  --   --      Estimated Creatinine Clearance: 65.1 mL/min (by C-G formula based on SCr of 1.08 mg/dL).   Medical History: Past Medical History:  Diagnosis Date   Alcohol abuse    Depression    Tobacco abuse     Medications:  No medications prior to admission.    Scheduled:   arformoterol  15 mcg Nebulization BID   budesonide (PULMICORT) nebulizer solution  0.5 mg Nebulization BID   doxycycline  100 mg Oral Q12H   [START ON 03/24/2021] predniSONE  20 mg Oral Q breakfast   Infusions:   amiodarone 30 mg/hr (03/23/21 0419)   cefTRIAXone (ROCEPHIN)  IV Stopped (03/22/21 1839)   heparin 1,400 Units/hr (03/23/21 1209)   PRN:   Assessment: Jack Barber with a history of tobacco and alcohol use. Patient is presenting with SOB. Heparin per pharmacy consult placed for atrial fibrillation.  Patient is not on anticoagulation prior to arrival.  Heparin level continues to be undetectable. We will increase again for now but will reach out to MD to see if we can convert him to Lovenox instead before transitioning to PO anticoagulation.   Goal of Therapy:  Heparin level 0.3-0.7  units/ml Monitor platelets by anticoagulation protocol: Yes   Plan:  Heparin 3000 units bolus Inc heparin to 1600 units/hr 6 hr heparin level  03/25/21, PharmD, [place], AAHIVP, CPP Infectious Disease Pharmacist 03/23/2021 4:29 PM
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
## 2021-03-23 NOTE — TOC Progression Note (Signed)
Transition of Care [facility]) - Progression Note    Patient Details  Name: Jack Barber MRN: [id number] Date of Birth: [birth year]  Transition of Care [facility]) CM/SW Contact  Leone Haven, RN Phone Number: 03/23/2021, 7:49 AM  Clinical Narrative:     Transition of Care [facility]) Screening Note   Patient Details  Name: Jack Barber Date of Birth: Aug 20, 1955   Transition of Care [facility]) CM/SW Contact:    Leone Haven, RN Phone Number: 03/23/2021, 7:49 AM    Transition of Care Department [facility]) has reviewed patient and no TOC needs have been identified at this time. We will continue to monitor patient advancement through interdisciplinary progression rounds. If new patient transition needs arise, please place a TOC consult.          Expected Discharge Plan and Services                                                 Social Determinants of Health (SDOH) Interventions    Readmission Risk Interventions No flowsheet data found.

## 2021-03-23 NOTE — Progress Notes (Signed)
NAME:  Jack Barber, MRN:  [id number], DOB:  January 06, 1956, LOS: 0 ADMISSION DATE:  03/22/2021, CONSULTATION DATE:  2/23 REFERRING MD:  Alfredia Ferguson, CHIEF COMPLAINT:  Dyspnea   History of Present Illness:  66 y/o male with a history of tobacco and alcohol abuse was admitted in the setting of dyspnea for 2-3 days and noted to be in atrial fibrillation with RVR.  PCCM saw for evaluation for ICU admission, recommended progressive care.  Pertinent  Medical History  Tobacco abuse Alcohol abuse Cocaine abuse asthma  Significant Hospital Events: Including procedures, antibiotic start and stop dates in addition to other pertinent events   2/23 admission  Interim History / Subjective:  Still feels like there is an elephant sitting on his chest Notes dyspnea on exertion and in the cold over the last few months Has never had a breathing test  Objective   Blood pressure 114/71, pulse 66, temperature 98.3 F (36.8 C), temperature source Oral, resp. rate (!) 30, height 5\' 8"  (1.727 m), weight 81.2 kg, SpO2 99 %.        Intake/Output Summary (Last 24 hours) at 03/23/2021 1117 Last data filed at 03/23/2021 0849 Gross per 24 hour  Intake 594 ml  Output 400 ml  Net 194 ml   Filed Weights   03/22/21 1649 03/22/21 2220 03/23/21 0300  Weight: 81.6 kg 81.2 kg 81.2 kg    Examination:  General:  mild tachypnea in bed, no accessory muscle use HENT: NCAT OP clear PULM: CTA B, increased effort, speaking in full sentences CV: RRR, no mgr GI: BS+, soft, nontender MSK: normal bulk and tone Neuro: awake, alert, no distress, MAEW  Resolved Hospital Problem list     Assessment & Plan:  Acute respiratory failure with hypoxemia> remains on 6L [place] O2 this morning Likely asthma exacerbation, possible COPD Cocaine abuse Tobacco abuse Lactic acidosis due to cocaine use and hypoxemia Alcohol abuse Atrial fibrillation  Homelessness  Discussion: He probably has an asthma exacerbation or COPD,  though it is puzzling that he doesn't wheeze.  Cocaine abuse and tobacco abuse no doubt contributing.  It's not clear to me why he needs 6L [place], hopefully this can be weaned off.  Plan: Wean off O2 for O2 saturation > 88% Add prednisone 20mg  daily x 5 days Add brovana D/c ipratropium Add albuterol prn Will need outpatient pulmonary follow up Counsel to quit using cocaine and tobacco  Will see as needed over weekend, call if questions   Best Practice (right click and "Reselect all SmartList Selections" daily)   Per [facility]  Labs   CBC: Recent Labs  Lab 03/22/21 1717 03/22/21 1732 03/23/21 0527  WBC 22.4*  --  18.8*  NEUTROABS 18.1*  --   --   HGB 17.4* 18.0* 13.3  HCT 51.5 53.0* 39.8  MCV 100.0  --  98.0  PLT 356  --  [id number]    Basic Metabolic Panel: Recent Labs  Lab 03/22/21 1717 03/22/21 1732 03/23/21 0527  NA 137 137 136  K 3.8 4.1 4.2  CL 101 102 106  CO2 23  --  22  GLUCOSE 114* 114* 201*  BUN 17 22 20   CREATININE 1.18 1.20 1.08  CALCIUM 9.0  --  8.6*  MG 2.0  --   --    GFR: Estimated Creatinine Clearance: 65.1 mL/min (by C-G formula based on SCr of 1.08 mg/dL). Recent Labs  Lab 03/22/21 1717 03/22/21 2046 03/23/21 0527  PROCALCITON  --  0.15 0.13  WBC  22.4*  --  18.8*  LATICACIDVEN 2.3* 1.8  --     Liver Function Tests: Recent Labs  Lab 03/22/21 1717  AST 26  ALT 22  ALKPHOS 75  BILITOT 1.5*  PROT 6.8  ALBUMIN 3.4*   Recent Labs  Lab 03/22/21 1717  LIPASE 33   No results for input(s): AMMONIA in the last 168 hours.  ABG    Component Value Date/Time   TCO2 26 03/22/2021 1732     Coagulation Profile: Recent Labs  Lab 03/22/21 1717  INR 1.1    Cardiac Enzymes: No results for input(s): CKTOTAL, CKMB, CKMBINDEX, TROPONINI in the last 168 hours.  HbA1C: No results found for: HGBA1C  CBG: No results for input(s): GLUCAP in the last 168 hours.  Critical care time: n/a    Roselie Awkward, MD [place] [facility] Pager:  [phone] Cell: [phone] After 7:00 pm call Elink  [phone]

## 2021-03-23 NOTE — Progress Notes (Signed)
ANTICOAGULATION CONSULT NOTE   Pharmacy Consult for Heparin Indication: atrial fibrillation  Allergies  Allergen Reactions   Codeine Itching    Patient Measurements: Height: 5\' 8"  (172.7 cm) Weight: 81.2 kg (179 lb 0.2 oz) IBW/kg (Calculated) : 68.4 Heparin Dosing Weight: 81.6 kg  Vital Signs: Temp: 98.2 F (36.8 C) (02/24 0300) Temp Source: Oral (02/24 0300) BP: 110/75 (02/24 0500) Pulse Rate: 72 (02/24 0500)  Labs: Recent Labs    03/22/21 1717 03/22/21 1732 03/22/21 2046 03/23/21 0527  HGB 17.4* 18.0*  --  13.3  HCT 51.5 53.0*  --  39.8  PLT 356  --   --  313  APTT 29  --   --   --   LABPROT 13.9  --   --   --   INR 1.1  --   --   --   HEPARINUNFRC  --   --   --  <0.10*  CREATININE 1.18 1.20  --  1.08  TROPONINIHS 18*  --  13  --      Estimated Creatinine Clearance: 65.1 mL/min (by C-G formula based on SCr of 1.08 mg/dL).   Medical History: Past Medical History:  Diagnosis Date   Alcohol abuse    Depression    Tobacco abuse     Medications:  No medications prior to admission.    Scheduled:   budesonide (PULMICORT) nebulizer solution  0.5 mg Nebulization BID   doxycycline  100 mg Oral Q12H   ipratropium  0.5 mg Nebulization TID   Infusions:   amiodarone 30 mg/hr (03/23/21 0419)   cefTRIAXone (ROCEPHIN)  IV Stopped (03/22/21 1839)   heparin 1,200 Units/hr (03/22/21 2042)   lactated ringers 150 mL/hr at 03/22/21 2140   PRN:   Assessment: 27 yom with a history of tobacco and alcohol use. Patient is presenting with SOB. Heparin per pharmacy consult placed for atrial fibrillation.  Patient is not on anticoagulation prior to arrival.  Hgb 18; plt 356  2/24 AM update:  Heparin level low  Goal of Therapy:  Heparin level 0.3-0.7 units/ml Monitor platelets by anticoagulation protocol: Yes   Plan:  Heparin 2000 units bolus Inc heparin to 1400 units/hr 1400 heparin level  Narda Bonds, PharmD, BCPS Clinical Pharmacist Phone:  [phone]

## 2021-03-23 NOTE — TOC Benefit Eligibility Note (Signed)
Patient Advocate Encounter  Insurance verification completed.    The patient is currently admitted and upon discharge could be taking Eliquis 5 mg.  The current 30 day co-pay is, $47.00.   The patient is currently admitted and upon discharge could be taking Xarelto 20 mg.  The current 30 day co-pay is, $47.00.   The patient is insured through [employer] Medicare Part D     Darci Lykins, CPhT Pharmacy Patient Advocate Specialist [facility] Patient Advocate Team Direct Number: [phone]  Fax: [phone]        

## 2021-03-24 ENCOUNTER — Inpatient Hospital Stay (HOSPITAL_COMMUNITY): Payer: Medicare Other

## 2021-03-24 ENCOUNTER — Other Ambulatory Visit: Payer: Self-pay | Admitting: Internal Medicine

## 2021-03-24 DIAGNOSIS — R7989 Other specified abnormal findings of blood chemistry: Secondary | ICD-10-CM | POA: Diagnosis not present

## 2021-03-24 DIAGNOSIS — D649 Anemia, unspecified: Secondary | ICD-10-CM

## 2021-03-24 DIAGNOSIS — J9601 Acute respiratory failure with hypoxia: Secondary | ICD-10-CM | POA: Diagnosis not present

## 2021-03-24 DIAGNOSIS — Z720 Tobacco use: Secondary | ICD-10-CM

## 2021-03-24 DIAGNOSIS — I4892 Unspecified atrial flutter: Secondary | ICD-10-CM | POA: Diagnosis not present

## 2021-03-24 DIAGNOSIS — F1010 Alcohol abuse, uncomplicated: Secondary | ICD-10-CM

## 2021-03-24 LAB — CBC WITH DIFFERENTIAL/PLATELET
Abs Immature Granulocytes: 0.12 10*3/uL — ABNORMAL HIGH (ref 0.00–0.07)
Basophils Absolute: 0.1 10*3/uL (ref 0.0–0.1)
Basophils Relative: 0 %
Eosinophils Absolute: 0 10*3/uL (ref 0.0–0.5)
Eosinophils Relative: 0 %
HCT: 37.7 % — ABNORMAL LOW (ref 39.0–52.0)
Hemoglobin: 12.7 g/dL — ABNORMAL LOW (ref 13.0–17.0)
Immature Granulocytes: 1 %
Lymphocytes Relative: 20 %
Lymphs Abs: 4.1 10*3/uL — ABNORMAL HIGH (ref 0.7–4.0)
MCH: 33.5 pg (ref 26.0–34.0)
MCHC: 33.7 g/dL (ref 30.0–36.0)
MCV: 99.5 fL (ref 80.0–100.0)
Monocytes Absolute: 1.5 10*3/uL — ABNORMAL HIGH (ref 0.1–1.0)
Monocytes Relative: 7 %
Neutro Abs: 14.7 10*3/uL — ABNORMAL HIGH (ref 1.7–7.7)
Neutrophils Relative %: 72 %
Platelets: 268 10*3/uL (ref 150–400)
RBC: 3.79 MIL/uL — ABNORMAL LOW (ref 4.22–5.81)
RDW: 12.6 % (ref 11.5–15.5)
WBC: 20.6 10*3/uL — ABNORMAL HIGH (ref 4.0–10.5)
nRBC: 0 % (ref 0.0–0.2)

## 2021-03-24 LAB — COMPREHENSIVE METABOLIC PANEL
ALT: 45 U/L — ABNORMAL HIGH (ref 0–44)
AST: 57 U/L — ABNORMAL HIGH (ref 15–41)
Albumin: 2.6 g/dL — ABNORMAL LOW (ref 3.5–5.0)
Alkaline Phosphatase: 51 U/L (ref 38–126)
Anion gap: 8 (ref 5–15)
BUN: 19 mg/dL (ref 8–23)
CO2: 22 mmol/L (ref 22–32)
Calcium: 9 mg/dL (ref 8.9–10.3)
Chloride: 107 mmol/L (ref 98–111)
Creatinine, Ser: 1.05 mg/dL (ref 0.61–1.24)
GFR, Estimated: >60 mL/min (ref >60)
Glucose, Bld: 121 mg/dL — ABNORMAL HIGH (ref 70–99)
Potassium: 4 mmol/L (ref 3.5–5.1)
Sodium: 137 mmol/L (ref 135–145)
Total Bilirubin: 0.3 mg/dL (ref 0.3–1.2)
Total Protein: 5.2 g/dL — ABNORMAL LOW (ref 6.5–8.1)

## 2021-03-24 LAB — URINE CULTURE

## 2021-03-24 LAB — HEMOGLOBIN A1C
Hgb A1c MFr Bld: 5.2 % (ref 4.8–5.6)
Mean Plasma Glucose: 102.54 mg/dL

## 2021-03-24 LAB — PHOSPHORUS: Phosphorus: 3.4 mg/dL (ref 2.5–4.6)

## 2021-03-24 LAB — MAGNESIUM: Magnesium: 1.9 mg/dL (ref 1.7–2.4)

## 2021-03-24 LAB — PROCALCITONIN: Procalcitonin: 0.12 ng/mL

## 2021-03-24 LAB — HEPARIN LEVEL (UNFRACTIONATED): Heparin Unfractionated: 0.41 IU/mL (ref 0.30–0.70)

## 2021-03-24 MED ORDER — HEPARIN BOLUS VIA INFUSION
2000.0000 [IU] | Freq: Once | INTRAVENOUS | Status: AC
  Administered 2021-03-24: 2000 [IU] via INTRAVENOUS
  Filled 2021-03-24: qty 2000

## 2021-03-24 MED ORDER — NICOTINE 21 MG/24HR TD PT24
21.0000 mg | MEDICATED_PATCH | Freq: Every day | TRANSDERMAL | Status: DC
  Administered 2021-03-24 – 2021-03-27 (×4): 21 mg via TRANSDERMAL
  Filled 2021-03-24 (×4): qty 1

## 2021-03-24 MED ORDER — AMIODARONE HCL 200 MG PO TABS
400.0000 mg | ORAL_TABLET | Freq: Two times a day (BID) | ORAL | Status: DC
  Administered 2021-03-24 – 2021-03-27 (×7): 400 mg via ORAL
  Filled 2021-03-24 (×7): qty 2

## 2021-03-24 MED ORDER — RIVAROXABAN 20 MG PO TABS
20.0000 mg | ORAL_TABLET | Freq: Every day | ORAL | Status: DC
  Administered 2021-03-24 – 2021-03-26 (×3): 20 mg via ORAL
  Filled 2021-03-24 (×3): qty 1
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
## 2021-03-24 NOTE — Assessment & Plan Note (Addendum)
-  Smoking cessation counseling given -Smokes at least 1 pack/day and had not been smoking drinking for the last 3 to 4 days given that he was feeling unwell -Initially declined nicotine patch but now is having significant cravings so we added a 21 mg TD patch and will continue at discharge

## 2021-03-24 NOTE — Assessment & Plan Note (Addendum)
   Likely multifactorial. Does have atrial flutter with RVR as above. Also concern for possible respiratory infection given elevated leukocytosis and other findings as above. >New oxygen requirement appears disproportionate to atrial flutter though he may have some underlying undiagnosed COPD/Pneumonitiswhich is an exacerbation considering his tobacco use/ Chemical exposure Hx. -SpO2: 94 % O2 Flow Rate (L/min): 4 L/min: Today he was off of supplemental oxygen via nasal cannula and on normal room air and had at home O2 screen done which shows that she does not need oxygen > Reports working at [employer] 14 years and has been hospitalized for exposures in the past. During last admission in [place] he was told that he had bad lungs with significant scarring. >If fails to improve could also consider possibility of PE but this is lower on the differential and patient is also already receiving heparin for his A-fib with RVR,so will be covered either way for now. >PCCM consulted and recommending inhalers but avoidance of albuterol considering tachycardia -Monitor on progressive unit - Supplemental oxygen, wean as tolerated - Continue with Pulmicort and Atrovent per pulm recommendations and recommending albuterol as needed - Treatment for possible respiratory infection as above -Pulmonary is adding Prednisone 20 mg x5 days this is made his leukocytosis slightly worse; pulmonary recommends continuing prednisone yesterday but now leukocytosis improving -Patient will need outpatient pulmonary follow-up and pulm recommends weaning off O2 for O2 saturation greater than 88% -Consider evaluation for PE if fails to respond as other known issues are treated and negative infectious work-up -Patient will need an ambulatory home O2 screen prior to discharge and will repeat his chest x-ray in the a.m. his today's chest x-ray showed "No active cardiopulmonary disease. Nodular opacity noted on the previous  day's study is no longer visualized. However, recommend follow-up chest radiographs with PA and lateral views, preferably in the department, when this patient can tolerate the procedure, for more definitive assessment." -Pulmonary recommends discharging with an ICS/LABA inhaler such as Breo 200 mcg p.o. or Wixela/Advair 500 mcg twice daily -Pulmonary to follow-up in outpatient setting and a referral will be made to [facility] pulmonary for follow-up and has been already scheduled.  Patient to be discharged on Breo Ellipta per pulmonary recommendations

## 2021-03-24 NOTE — Assessment & Plan Note (Addendum)
>  Patient presenting feeling unwell with shortness of breath and cough for the past 4 days. Found to be in atrial flutter with RVR at a rate in the 130s to 150s in the ED. >Cardiology has been consulted and recommended amiodarone and starting heparin drip for possible need for TEE conversion. Also plan for echo. >Mild elevations in BNP at 242, troponin at 18, lactic acid 2.2 likely all due to this ongoing A-fib RVR. - Appreciate cardiology recommendations - Monitor on progressive unit - Amiodarone drip initated but this was transitioned to p.o. amiodarone by cardiology yesterday and he is improved -Patient has a CHA2DS2-VASc score of at least 1 -Was found to be in a flutter 2-1 block with a right bundle branch block - Heparin drip initiated and will be transitioned to Xarelto on 03/24/2021. - Echocardiogram done  and showed an EF of 55 to 60% with grade 2 diastolic dysfunction with a normal right ventricular systolic function -Treat for suspected infection as below -Check TSH and was normal -Cardiology recommending checking daily EKG to follow QTc -Per cardiology in the setting of his CHA2DS2-VASc score of 1 patient is to be on anticoagulation for 4 weeks since then do not know how long he was in a flutter prior to admission and that he may also have a left atrial thrombus at this point -Appreciate cardiology recommendations and they are recommending Xarelto for a month and then continuing amiodarone 200 mg p.o. twice daily upon discharge and then stopping amiodarone-plan as well.  If he has recurrence of a flutter they are planning for an EP study and ablation as an outpatient

## 2021-03-24 NOTE — Progress Notes (Signed)
ANTICOAGULATION CONSULT NOTE   Pharmacy Consult for Heparin Indication: atrial fibrillation  Allergies  Allergen Reactions   Codeine Itching    Patient Measurements: Height: 5\' 8"  (172.7 cm) Weight: 81 kg (178 lb 9.2 oz) IBW/kg (Calculated) : 68.4 Heparin Dosing Weight: 81.6 kg  Vital Signs: Temp: 97.7 F (36.5 C) (02/25 0742) Temp Source: Oral (02/25 0742) BP: 123/76 (02/25 0742) Pulse Rate: 64 (02/25 0742)  Labs: Recent Labs    03/22/21 1717 03/22/21 1732 03/22/21 1732 03/22/21 2046 03/23/21 0527 03/23/21 1447 03/23/21 2325 03/24/21 0904  HGB 17.4* 18.0*  --   --  13.3  --  12.7*  --   HCT 51.5 53.0*  --   --  39.8  --  37.7*  --   PLT 356  --   --   --  313  --  268  --   APTT 29  --   --   --   --   --   --   --   LABPROT 13.9  --   --   --   --   --   --   --   INR 1.1  --   --   --   --   --   --   --   HEPARINUNFRC  --   --    < >  --  <0.10* <0.10* 0.17* 0.41  CREATININE 1.18 1.20  --   --  1.08  --  1.05  --   TROPONINIHS 18*  --   --  13  --   --   --   --    < > = values in this interval not displayed.     Estimated Creatinine Clearance: 67 mL/min (by C-G formula based on SCr of 1.05 mg/dL).   Medical History: Past Medical History:  Diagnosis Date   Alcohol abuse    Depression    Tobacco abuse     Medications:  No medications prior to admission.    Scheduled:   amiodarone  400 mg Oral BID   arformoterol  15 mcg Nebulization BID   budesonide (PULMICORT) nebulizer solution  0.5 mg Nebulization BID   doxycycline  100 mg Oral Q12H   predniSONE  20 mg Oral Q breakfast   Infusions:   cefTRIAXone (ROCEPHIN)  IV 2 g (03/23/21 1634)   heparin 1,800 Units/hr (03/24/21 0219)   PRN:   Assessment: 65 yom with a history of tobacco and alcohol use. Patient is presenting with SOB. Heparin per pharmacy consult placed for atrial fibrillation.  Patient is not on anticoagulation prior to arrival.  Hgb 18; plt 356  Heparin level therapeutic at  0.41. Spoke with MD and plan to transition to Xarelto. Will give Xarelto with dinner tonight.  Goal of Therapy:  Heparin level 0.3-0.7 units/ml Monitor platelets by anticoagulation protocol: Yes   Plan:  Start Xarelto 15 mg daily with supper Stop heparin when Xarelto is given  Thank you for allowing pharmacy to participate in this patient's care.  03/26/21, PharmD PGY1 Pharmacy Resident 03/24/2021 12:11 PM Check [email or web address] for unit specific pharmacy number

## 2021-03-24 NOTE — Assessment & Plan Note (Addendum)
-   Reports drinking 1 beer a day with dinner but has not been drinking in the last 3 to 4 days doing feeling unwell -Denies any withdrawal symptoms and is beyond the timeframe when withdrawal would be expected to start -LFTs are abnormal but could be in the setting of steroids and amiodarone.  LFTs are improving and ALT is now 82 -HIDA scan negative

## 2021-03-24 NOTE — Assessment & Plan Note (Addendum)
-   See leukocytosis -Patient did have abnormal ultrasound findings on his right upper quadrant and question if this is acute cholecystitis so we will obtain a HIDA scan; HIDA scan was negative and he is stable for discharge and please complete his antibiotic

## 2021-03-24 NOTE — Assessment & Plan Note (Addendum)
-  Patient noted to have significant leukocytosis in the ED at 22.4 and has now been trending down and went to 20.6 -> 16.0 -> 15.2 and is now 16.4 in the setting of steroids >Concern for possible infection given ? reported fever. There could be a degree of hemoconcentration given elevated hemoglobin as well as a degree of reactive leukocytosis given ongoing A-fib with RVR. >Concern for possible pneumonia versus UTI although chest x-ray was clear lungs are rhonchorous and patient has new oxygen requirement as below. No source currently identified. >Does meet SIRS criteria with tachycardia, tachypnea, leukocytosis, however there could be alternative explanations for this given ongoing atrial flutter with RVR and less likely infectious. >Received ceftriaxone and doxycycline in the ED (doxycycline chosen over azithromycin due to effect on QTc and antiarrhythmic being confused) -Continue to monitor on telemetry as above - Continued with ceftriaxone and doxycycline for now and completed 5 days - Check procalcitonin and was 0.15 -> 0.13 and is now 0.12 - Follow-up urinalysis, urine culture, blood culture -continue to trend fever curve and white count and he has been afebrile and white blood cell count is stable has been trending down -Leukocytosis likely worsened in the setting of steroid demargination given that he was placed on prednisone.  Patient to complete his prednisone dose tomorrow -Ultrasound done and showed "Thickened gallbladder wall with pericholecystic fluid, sludge without shadowing stones. Differential diagnosis in the absence of positive sonographic Murphy sign is chronic cholecystitis, pain medicated acute acalculous cholecystitis, congestive or reactive wall thickening, and infiltrating disease." -Checking HIDA scan and WBC relatively stable -HIDA negative -Repeat CBC within 1 week and I clinically do not feel that he has an infection at this time

## 2021-03-24 NOTE — Assessment & Plan Note (Addendum)
-   Patient's hemoglobin/hematocrit went from 13.3/39.8 -> 12.7/37.7 -> 12.4/36.5 and is now 12.1/35.8 and is now 12.4/37.0 -Anemia panel was checked and showed an iron level of 41, U IBC of 205, TIBC of 246, saturation of 17%, ferritin of 159, folate level 1.4, vitamin B12 of 244 -Continue to monitor for signs and symptoms of bleeding; currently no overt bleeding noted -Repeat CBC within 1 week

## 2021-03-24 NOTE — Assessment & Plan Note (Addendum)
RUQ Pain, improved -LFT's improving but patient has some abdominal Pain  -Patient's LFTs were normal on admission but now worsened and AST is trended up from 57 -> 64 -> 35 and today was 33 and ALT went from 45 -> 75 -> 72 and today was 82 -Obtain right upper quadrant ultrasound as well as an acute hepatitis panel; could be it worsened in the setting of steroids and amiodarone -Continue monitor and trend hepatic function panel carefully and if necessary will obtain a right upper quadrant ultrasound as well as a acute hepatitis panel; Acute Hepatitis Panel Negative -RUQ U/S showed "Thickened gallbladder wall with pericholecystic fluid, sludge without shadowing stones. Differential diagnosis in the absence of positive sonographic Murphy sign is chronic cholecystitis, pain medicated acute acalculous cholecystitis, congestive or reactive wall thickening, and infiltrating disease." -Case was discussed with General Surgery who recommended obtaining a HIDA scan and if the patient had a positive HIDA scan and general surgery will formally consult -HIDA scan negative and normal -Repeat CMP within 1 week
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
## 2021-03-24 NOTE — Progress Notes (Signed)
Heart rate 48-54/min. Amio drip paused. [facility] cardiology notified. Waiting for orders.

## 2021-03-24 NOTE — Progress Notes (Deleted)
{  Select_TRH_Note:[id number]} 

## 2021-03-24 NOTE — Progress Notes (Signed)
ANTICOAGULATION CONSULT NOTE   Pharmacy Consult for Heparin Indication: atrial fibrillation  Allergies  Allergen Reactions   Codeine Itching    Patient Measurements: Height: 5\' 8"  (172.7 cm) Weight: 81.2 kg (179 lb 0.2 oz) IBW/kg (Calculated) : 68.4 Heparin Dosing Weight: 81.6 kg  Vital Signs:    Labs: Recent Labs    03/22/21 1717 03/22/21 1732 03/22/21 2046 03/23/21 0527 03/23/21 1447 03/23/21 2325  HGB 17.4* 18.0*  --  13.3  --   --   HCT 51.5 53.0*  --  39.8  --   --   PLT 356  --   --  313  --   --   APTT 29  --   --   --   --   --   LABPROT 13.9  --   --   --   --   --   INR 1.1  --   --   --   --   --   HEPARINUNFRC  --   --   --  <0.10* <0.10* 0.17*  CREATININE 1.18 1.20  --  1.08  --   --   TROPONINIHS 18*  --  13  --   --   --      Estimated Creatinine Clearance: 65.1 mL/min (by C-G formula based on SCr of 1.08 mg/dL).   Medical History: Past Medical History:  Diagnosis Date   Alcohol abuse    Depression    Tobacco abuse     Medications:  No medications prior to admission.    Scheduled:   arformoterol  15 mcg Nebulization BID   budesonide (PULMICORT) nebulizer solution  0.5 mg Nebulization BID   doxycycline  100 mg Oral Q12H   predniSONE  20 mg Oral Q breakfast   Infusions:   amiodarone 30 mg/hr (03/23/21 1632)   cefTRIAXone (ROCEPHIN)  IV 2 g (03/23/21 1634)   heparin 1,600 Units/hr (03/23/21 1633)   PRN:   Assessment: 69 yom with a history of tobacco and alcohol use. Patient is presenting with SOB. Heparin per pharmacy consult placed for atrial fibrillation.  Patient is not on anticoagulation prior to arrival.  Hgb 18; plt 356  2/25 AM update:  Heparin level low but trending up  Goal of Therapy:  Heparin level 0.3-0.7 units/ml Monitor platelets by anticoagulation protocol: Yes   Plan:  Heparin 2000 units bolus Inc heparin to 1800 units/hr 0900 heparin level  Narda Bonds, PharmD, BCPS Clinical Pharmacist Phone:  [phone]

## 2021-03-25 ENCOUNTER — Other Ambulatory Visit: Payer: Self-pay

## 2021-03-25 ENCOUNTER — Inpatient Hospital Stay (HOSPITAL_COMMUNITY): Payer: Medicare Other

## 2021-03-25 DIAGNOSIS — I4892 Unspecified atrial flutter: Secondary | ICD-10-CM | POA: Diagnosis not present

## 2021-03-25 DIAGNOSIS — J9601 Acute respiratory failure with hypoxia: Secondary | ICD-10-CM | POA: Diagnosis not present

## 2021-03-25 DIAGNOSIS — R7989 Other specified abnormal findings of blood chemistry: Secondary | ICD-10-CM | POA: Diagnosis not present

## 2021-03-25 DIAGNOSIS — F1010 Alcohol abuse, uncomplicated: Secondary | ICD-10-CM | POA: Diagnosis not present

## 2021-03-25 LAB — COMPREHENSIVE METABOLIC PANEL
ALT: 75 U/L — ABNORMAL HIGH (ref 0–44)
AST: 64 U/L — ABNORMAL HIGH (ref 15–41)
Albumin: 2.9 g/dL — ABNORMAL LOW (ref 3.5–5.0)
Alkaline Phosphatase: 59 U/L (ref 38–126)
Anion gap: 9 (ref 5–15)
BUN: 13 mg/dL (ref 8–23)
CO2: 25 mmol/L (ref 22–32)
Calcium: 9.1 mg/dL (ref 8.9–10.3)
Chloride: 106 mmol/L (ref 98–111)
Creatinine, Ser: 0.89 mg/dL (ref 0.61–1.24)
GFR, Estimated: >60 mL/min (ref >60)
Glucose, Bld: 102 mg/dL — ABNORMAL HIGH (ref 70–99)
Potassium: 3.6 mmol/L (ref 3.5–5.1)
Sodium: 140 mmol/L (ref 135–145)
Total Bilirubin: <0.1 mg/dL — ABNORMAL LOW (ref 0.3–1.2)
Total Protein: 5.4 g/dL — ABNORMAL LOW (ref 6.5–8.1)

## 2021-03-25 LAB — CBC WITH DIFFERENTIAL/PLATELET
Abs Immature Granulocytes: 0.1 10*3/uL — ABNORMAL HIGH (ref 0.00–0.07)
Basophils Absolute: 0 10*3/uL (ref 0.0–0.1)
Basophils Relative: 0 %
Eosinophils Absolute: 0.1 10*3/uL (ref 0.0–0.5)
Eosinophils Relative: 0 %
HCT: 36.5 % — ABNORMAL LOW (ref 39.0–52.0)
Hemoglobin: 12.4 g/dL — ABNORMAL LOW (ref 13.0–17.0)
Immature Granulocytes: 1 %
Lymphocytes Relative: 26 %
Lymphs Abs: 4.1 10*3/uL — ABNORMAL HIGH (ref 0.7–4.0)
MCH: 33.8 pg (ref 26.0–34.0)
MCHC: 34 g/dL (ref 30.0–36.0)
MCV: 99.5 fL (ref 80.0–100.0)
Monocytes Absolute: 1.5 10*3/uL — ABNORMAL HIGH (ref 0.1–1.0)
Monocytes Relative: 9 %
Neutro Abs: 10.2 10*3/uL — ABNORMAL HIGH (ref 1.7–7.7)
Neutrophils Relative %: 64 %
Platelets: 333 10*3/uL (ref 150–400)
RBC: 3.67 MIL/uL — ABNORMAL LOW (ref 4.22–5.81)
RDW: 12.7 % (ref 11.5–15.5)
WBC: 16 10*3/uL — ABNORMAL HIGH (ref 4.0–10.5)
nRBC: 0 % (ref 0.0–0.2)

## 2021-03-25 LAB — MAGNESIUM: Magnesium: 1.9 mg/dL (ref 1.7–2.4)

## 2021-03-25 LAB — PHOSPHORUS: Phosphorus: 4.3 mg/dL (ref 2.5–4.6)

## 2021-03-25 MED ORDER — TRAZODONE HCL 50 MG PO TABS
50.0000 mg | ORAL_TABLET | Freq: Every evening | ORAL | Status: DC | PRN
  Administered 2021-03-25: 50 mg via ORAL
  Filled 2021-03-25: qty 1

## 2021-03-25 NOTE — Progress Notes (Addendum)
Progress Note  Patient Name: Jack Barber Date of Encounter: 03/25/2021  [facility] Cardiologist: None   Subjective   Denies any chest pain and SOB has improved.  Tele shows NSR   Inpatient Medications    Scheduled Meds:  amiodarone  400 mg Oral BID   arformoterol  15 mcg Nebulization BID   budesonide (PULMICORT) nebulizer solution  0.5 mg Nebulization BID   doxycycline  100 mg Oral Q12H   nicotine  21 mg Transdermal Daily   predniSONE  20 mg Oral Q breakfast   rivaroxaban  20 mg Oral Q supper   Continuous Infusions:  cefTRIAXone (ROCEPHIN)  IV 2 g (03/24/21 1618)   PRN Meds: acetaminophen, albuterol, ondansetron (ZOFRAN) IV   Vital Signs    Vitals:   03/25/21 0049 03/25/21 0421 03/25/21 0719 03/25/21 0908  BP: 123/70 (!) 142/77 137/71   Pulse: 65 67 71 73  Resp: 19 20 (!) 22   Temp: 97.6 F (36.4 C) 98.1 F (36.7 C) 97.7 F (36.5 C)   TempSrc:  Oral Oral   SpO2: 97% 97% 92%   Weight:  85.5 kg    Height:        Intake/Output Summary (Last 24 hours) at 03/25/2021 1042 Last data filed at 03/25/2021 0901 Gross per 24 hour  Intake 480 ml  Output 1358 ml  Net -878 ml    Last 3 Weights 03/25/2021 03/24/2021 03/23/2021  Weight (lbs) 188 lb 8 oz 178 lb 9.2 oz 179 lb 0.2 oz  Weight (kg) 85.503 kg 81 kg 81.2 kg      Telemetry    NSR - Personally Reviewed  ECG   NSR with RBBB- Personally Reviewed  Physical Exam   Vitals:   03/25/21 0719 03/25/21 0908  BP: 137/71   Pulse: 71 73  Resp: (!) 22   Temp: 97.7 F (36.5 C)   SpO2: 92%    GEN: Well nourished, well developed in no acute distress HEENT: Normal NECK: No JVD; No carotid bruits LYMPHATICS: No lymphadenopathy CARDIAC:RRR, no murmurs, rubs, gallops RESPIRATORY:  Clear to auscultation without rales, wheezing or rhonchi  ABDOMEN: Soft, non-tender, non-distended MUSCULOSKELETAL:  No edema; No deformity  SKIN: Warm and dry NEUROLOGIC:  Alert and oriented x 3 PSYCHIATRIC:  Normal affect    Labs    High Sensitivity Troponin:   Recent Labs  Lab 03/22/21 1717 03/22/21 2046  TROPONINIHS 18* 13      Chemistry Recent Labs  Lab 03/22/21 1717 03/22/21 1732 03/23/21 0527 03/23/21 2325 03/25/21 0312  NA 137   < > 136 137 140  K 3.8   < > 4.2 4.0 3.6  CL 101   < > 106 107 106  CO2 23  --  22 22 25   GLUCOSE 114*   < > 201* 121* 102*  BUN 17   < > 20 19 13   CREATININE 1.18   < > 1.08 1.05 0.89  CALCIUM 9.0  --  8.6* 9.0 9.1  MG 2.0  --   --  1.9 1.9  PROT 6.8  --   --  5.2* 5.4*  ALBUMIN 3.4*  --   --  2.6* 2.9*  AST 26  --   --  57* 64*  ALT 22  --   --  45* 75*  ALKPHOS 75  --   --  51 59  BILITOT 1.5*  --   --  0.3 <0.1*  GFRNONAA >60  --  >60 >60 >60  ANIONGAP 13  --  8 8 9    < > = values in this interval not displayed.     Lipids  Recent Labs  Lab 03/23/21 0527  CHOL 100  TRIG 57  HDL 22*  LDLCALC 67  CHOLHDL 4.5     Hematology Recent Labs  Lab 03/23/21 0527 03/23/21 2325 03/25/21 0312  WBC 18.8* 20.6* 16.0*  RBC 4.06* 3.79* 3.67*  HGB 13.3 12.7* 12.4*  HCT 39.8 37.7* 36.5*  MCV 98.0 99.5 99.5  MCH 32.8 33.5 33.8  MCHC 33.4 33.7 34.0  RDW 12.4 12.6 12.7  PLT 313 268 333    Thyroid  Recent Labs  Lab 03/22/21 2046  TSH 0.413     BNP Recent Labs  Lab 03/22/21 1717  BNP 242.2*     DDimer No results for input(s): DDIMER in the last 168 hours.   Radiology    DG CHEST PORT 1 VIEW  Result Date: 03/25/2021 CLINICAL DATA:  Short of breath. EXAM: PORTABLE CHEST 1 VIEW COMPARISON:  03/24/2021 and 03/22/2021. FINDINGS: Opacity seen projecting below the right hemidiaphragm on the previous day's study is no longer visualized. Normal heart, mediastinum and hila. Clear lungs.  No convincing pleural effusion or pneumothorax. IMPRESSION: 1. No active cardiopulmonary disease. 2. Nodular opacity noted on the previous day's study is no longer visualized. However, recommend follow-up chest radiographs with PA and lateral views, preferably in  the department, when this patient can tolerate the procedure, for more definitive assessment. Electronically Signed   By: Lajean Manes M.D.   On: 03/25/2021 09:55   DG CHEST PORT 1 VIEW  Result Date: 03/24/2021 CLINICAL DATA:  Shortness of breath EXAM: PORTABLE CHEST 1 VIEW COMPARISON:  03/22/2021 FINDINGS: Cardiac size is within normal limits. There are no signs of pulmonary edema. There is 5.3 cm radiopacity in the right lower chest. This may suggest an infiltrate in the posterior right lower lung fields or structure under the right hemidiaphragm. This finding is not distinctly seen in the previous study. Rest of the lung fields are unremarkable. Lateral CP angles are clear. There is no pneumothorax. IMPRESSION: There are no signs of pulmonary edema. There is 5.3 cm nodular density in the right lower lung fields which may suggest pneumonia or neoplastic process in the lung fields or lesion under the right hemidiaphragm. Follow-up PA and lateral views of chest along with CT if warranted should be considered. Electronically Signed   By: Elmer Picker M.D.   On: 03/24/2021 09:20    Cardiac Studies   2D echo 03/23/2021 IMPRESSIONS    1. Left ventricular ejection fraction, by estimation, is 55 to 60%. The  left ventricle has normal function. The left ventricle has no regional  wall motion abnormalities. Left ventricular diastolic parameters are  consistent with Grade II diastolic  dysfunction (pseudonormalization).   2. Right ventricular systolic function is normal. The right ventricular  size is normal. There is mildly elevated pulmonary artery systolic  pressure. The estimated right ventricular systolic pressure is [id number] mmHg.   3. The mitral valve is normal in structure. Mild mitral valve  regurgitation. No evidence of mitral stenosis.   4. Tricuspid valve regurgitation is moderate.   5. The aortic valve is calcified. There is mild calcification of the  aortic valve. Aortic valve  regurgitation is not visualized. Aortic valve  sclerosis is present, with no evidence of aortic valve stenosis.   6. The inferior vena cava is dilated in size with <50% respiratory  variability,  suggesting right atrial pressure of 15 mmHg.     Patient Profile     Jack Barber is a 66 y.o. male with a hx of tobacco and alcohol abuse who is being seen 03/22/2021 for the evaluation of atrial flutter in the setting of SIRS being managed with PNA tx on antibiotics at the request of Dr. Ashok Cordia.   Assessment & Plan    #Atrial flutter:   -Chads2vasc = 1.  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 Also in the setting of SIRS. -he remains in NSR on tele  on oral Amio load with Amio 400 mg BID for 1 week, 400 mg daily for 1 week and then 200mg  daily>>given young age would not keep on Amio long term and best option is ablation -Followup with EP outpt for consideration of aflutter ablation -echo with normal LVF and G2DD mild PHTN and mil MR -TSH normal -LFTs normal on admit but bumped and continue to trend up slightly today but no 2x's normal -will repeat LFTs again in 2 days >>occurred since starting Amio but also on steroids which can bump the LFTs.  He has a hx of ETOH abuse in the past so may have underlying liver disease -needs to be on anticoagulation for 4 weeks even in setting of CHADS2VASC of 1 since we do not know how long he was in it PTA but likely at least 4 days and may have LAA thrombus at this point.  -Continue Xarelto  -QTc 539ms in setting of RBBB (QTc 462ms corrected for RBBB)  Hypoxemia:  -in the setting of SIRs.  -May have underlying lung pathology.  -Hypoxemia improved>>O2 sats 98% RA -New oxygen requirement appears disproportionate to atrial flutter though he may have some underlying undiagnosed COPD/Pneumonitis which is an exacerbation considering his tobacco use / Chemical exposure Hx. -managed with CAP antibiotics therapy and pulm toilet;  however procalcitonin is low, may be viral?; PE is on the differential , echo showed normal RV with mild PHTN (37mmHg) -suspect PHTN related to hypoxemia -Pulmonary following   For questions or updates, please contact [facility] Please consult [email or web address] for contact info under        Signed, Fransico Him, MD  03/25/2021, 10:42 AM
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
## 2021-03-25 NOTE — Progress Notes (Signed)
Patient requesting something to help him sleep at night. Says his mind is "wired" and he couldn't rest.

## 2021-03-25 NOTE — Progress Notes (Signed)
Informed that patient will be going for [place] ABD at 10pm. Need to be NPO for 6 hours. Patient was informed at 4pm. Verbalized understanding.

## 2021-03-26 ENCOUNTER — Other Ambulatory Visit (HOSPITAL_COMMUNITY): Payer: Self-pay

## 2021-03-26 LAB — COMPREHENSIVE METABOLIC PANEL
ALT: 72 U/L — ABNORMAL HIGH (ref 0–44)
AST: 35 U/L (ref 15–41)
Albumin: 2.7 g/dL — ABNORMAL LOW (ref 3.5–5.0)
Alkaline Phosphatase: 52 U/L (ref 38–126)
Anion gap: 7 (ref 5–15)
BUN: 13 mg/dL (ref 8–23)
CO2: 25 mmol/L (ref 22–32)
Calcium: 8.9 mg/dL (ref 8.9–10.3)
Chloride: 106 mmol/L (ref 98–111)
Creatinine, Ser: 0.99 mg/dL (ref 0.61–1.24)
GFR, Estimated: >60 mL/min (ref >60)
Glucose, Bld: 110 mg/dL — ABNORMAL HIGH (ref 70–99)
Potassium: 3.9 mmol/L (ref 3.5–5.1)
Sodium: 138 mmol/L (ref 135–145)
Total Bilirubin: 0.4 mg/dL (ref 0.3–1.2)
Total Protein: 5 g/dL — ABNORMAL LOW (ref 6.5–8.1)

## 2021-03-26 LAB — HEPATITIS PANEL, ACUTE
HCV Ab: NONREACTIVE
Hep A IgM: NONREACTIVE
Hep B C IgM: NONREACTIVE
Hepatitis B Surface Ag: NONREACTIVE

## 2021-03-26 LAB — CBC WITH DIFFERENTIAL/PLATELET
Abs Immature Granulocytes: 0.12 10*3/uL — ABNORMAL HIGH (ref 0.00–0.07)
Basophils Absolute: 0 10*3/uL (ref 0.0–0.1)
Basophils Relative: 0 %
Eosinophils Absolute: 0 10*3/uL (ref 0.0–0.5)
Eosinophils Relative: 0 %
HCT: 35.8 % — ABNORMAL LOW (ref 39.0–52.0)
Hemoglobin: 12.1 g/dL — ABNORMAL LOW (ref 13.0–17.0)
Immature Granulocytes: 1 %
Lymphocytes Relative: 26 %
Lymphs Abs: 3.9 10*3/uL (ref 0.7–4.0)
MCH: 33.5 pg (ref 26.0–34.0)
MCHC: 33.8 g/dL (ref 30.0–36.0)
MCV: 99.2 fL (ref 80.0–100.0)
Monocytes Absolute: 1.5 10*3/uL — ABNORMAL HIGH (ref 0.1–1.0)
Monocytes Relative: 10 %
Neutro Abs: 9.6 10*3/uL — ABNORMAL HIGH (ref 1.7–7.7)
Neutrophils Relative %: 63 %
Platelets: 319 10*3/uL (ref 150–400)
RBC: 3.61 MIL/uL — ABNORMAL LOW (ref 4.22–5.81)
RDW: 12.8 % (ref 11.5–15.5)
WBC: 15.2 10*3/uL — ABNORMAL HIGH (ref 4.0–10.5)
nRBC: 0 % (ref 0.0–0.2)

## 2021-03-26 LAB — MAGNESIUM: Magnesium: 1.9 mg/dL (ref 1.7–2.4)

## 2021-03-26 LAB — PHOSPHORUS: Phosphorus: 3.5 mg/dL (ref 2.5–4.6)

## 2021-03-26 LAB — BILIRUBIN, DIRECT: Bilirubin, Direct: 0.1 mg/dL (ref 0.0–0.2)

## 2021-03-26 MED ORDER — FLUTICASONE FUROATE-VILANTEROL 200-25 MCG/ACT IN AEPB
1.0000 | INHALATION_SPRAY | Freq: Every day | RESPIRATORY_TRACT | Status: DC
  Administered 2021-03-27: 1 via RESPIRATORY_TRACT
  Filled 2021-03-26: qty 28

## 2021-03-26 MED ORDER — RIVAROXABAN 20 MG PO TABS
20.0000 mg | ORAL_TABLET | Freq: Every day | ORAL | 0 refills | Status: DC
  Filled 2021-03-26: qty 30, 30d supply, fill #0

## 2021-03-26 MED ORDER — TRAZODONE HCL 100 MG PO TABS
100.0000 mg | ORAL_TABLET | Freq: Every evening | ORAL | Status: DC | PRN
  Administered 2021-03-26: 100 mg via ORAL
  Filled 2021-03-26: qty 1

## 2021-03-26 NOTE — TOC Progression Note (Addendum)
Transition of Care [facility]) - Progression Note    Patient Details  Name: GRANVEL PROUDFOOT MRN: [id number] Date of Birth: 07/25/1955  Transition of [facility]) CM/SW Contact  Leone Haven, RN Phone Number: 03/26/2021, 11:59 AM  Clinical Narrative:    Patient lives in a boarding house, where he pays rent.  He states he has no transportation to get home at dc.  NCM checked with his insurance and he does not have any transportation benefits.  Will need to asst with transport at dc. Xarelto will be sent to [facility] pharmacy to be filled.  NCM checked with [facility] for cost, spoke with Velma she states from [address] to home address is 23 miles, cost will be 58.00.  NCM informed supervisor of this information.   NCM called [facility] [phone] to get patient set up on mail in pharmacy for meds.  He is set up , but the doctor will need to fax the scripts that need refills to [phone]. ,  physician to physican phone is also [phone].  NCM informed MD of this information.        Expected Discharge Plan and Services                                                 Social Determinants of Health (SDOH) Interventions    Readmission Risk Interventions No flowsheet data found.

## 2021-03-26 NOTE — Plan of Care (Signed)
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
## 2021-03-26 NOTE — Progress Notes (Signed)
Patient requests bed alarm to be turned off, call bell in reach and patient knows to call for help if/when needed.

## 2021-03-26 NOTE — Discharge Instructions (Signed)
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
## 2021-03-26 NOTE — Progress Notes (Signed)
Mobility Specialist Progress Note:   03/26/21 1100  Mobility  Activity Ambulated with assistance in hallway  Level of Assistance Standby assist, set-up cues, supervision of patient - no hands on  Assistive Device Front wheel walker  Distance Ambulated (ft) 100 ft  Activity Response Tolerated fair  $Mobility charge 1 Mobility   Pt agreeable to mobility session this am. Pt with chronic R foot pain, causing him to walk on heel of foot, creating unsteady gait. Pt not requiring any physical assist at this time. States he feels his mobility is at baseline. Pt back sitting EOB with PA in room.   [facility] Phone: [phone] Office Phone: [phone]

## 2021-03-26 NOTE — Progress Notes (Signed)
NAME:  Jack Barber, MRN:  [id number], DOB:  01-20-1956, LOS: 3 ADMISSION DATE:  03/22/2021, CONSULTATION DATE:  2/23 REFERRING MD:  Marland Mcalpine, CHIEF COMPLAINT:  Dyspnea   History of Present Illness:  66 y/o male with a history of tobacco and alcohol abuse was admitted in the setting of dyspnea for 2-3 days and noted to be in atrial fibrillation with RVR.  PCCM saw for evaluation for ICU admission, recommended progressive care.  Pertinent  Medical History  Tobacco abuse Alcohol abuse Cocaine abuse asthma  Significant Hospital Events: Including procedures, antibiotic start and stop dates in addition to other pertinent events   2/23 admission 2/27 feeling better, on RA  Interim History / Subjective:  Breathing much better, off O2. Some chest soreness from coughing.   Objective   Blood pressure (!) 127/103, pulse 68, temperature 97.8 F (36.6 C), temperature source Oral, resp. rate 20, height 5\' 8"  (1.727 m), weight 85.2 kg, SpO2 96 %.        Intake/Output Summary (Last 24 hours) at 03/26/2021 1028 Last data filed at 03/26/2021 03/28/2021 Gross per 24 hour  Intake 1750.55 ml  Output 1700 ml  Net 50.55 ml    Filed Weights   03/24/21 0400 03/25/21 0421 03/26/21 0227  Weight: 81 kg 85.5 kg 85.2 kg    Examination:  General:  lying in bed in NAD HENT: NCAT OP clear PULM: CTA B, speaking in full sentences CV: RRR, no mgr GI: BS+, soft, nontender MSK: normal bulk and tone Neuro: awake, alert, no distress, MAEW  Resolved Hospital Problem list     Assessment & Plan:  Acute respiratory failure with hypoxemia (resolved) Likely asthma exacerbation (eos 400 2022 CareEverywhere), possible COPD (no PFTs) Cocaine abuse Tobacco abuse Lactic acidosis due to cocaine use and hypoxemia Alcohol abuse Atrial fibrillation  Homelessness  Discussion: He probably has an asthma exacerbation or COPD, though no wheeze on exam.  Cocaine abuse and tobacco abuse likely contributing.    Plan: Continue prednisone 20mg  daily x 5 days Continue brovana, budesonide Continue albuterol prn He has no transportation and can not get to OP f/u visits TOC consult for meds,  transportation for OP visits Discharge with ICS/LABA inhaler (breo 200 mcg daily, wixela/advair 500 mcg BID, etc), will need education on resources to obtain meds  PCCM will sign off, if can get to OP visits please refer to [facility] for f/u. Best Practice (right click and "Reselect all SmartList Selections" daily)   Per [facility]  Labs   CBC: Recent Labs  Lab 03/22/21 1717 03/22/21 1732 03/23/21 0527 03/23/21 2325 03/25/21 0312 03/26/21 0115  WBC 22.4*  --  18.8* 20.6* 16.0* 15.2*  NEUTROABS 18.1*  --   --  14.7* 10.2* 9.6*  HGB 17.4* 18.0* 13.3 12.7* 12.4* 12.1*  HCT 51.5 53.0* 39.8 37.7* 36.5* 35.8*  MCV 100.0  --  98.0 99.5 99.5 99.2  PLT 356  --  313 268 333 319     Basic Metabolic Panel: Recent Labs  Lab 03/22/21 1717 03/22/21 1732 03/23/21 0527 03/23/21 2325 03/25/21 0312 03/26/21 0115  NA 137 137 136 137 140 138  K 3.8 4.1 4.2 4.0 3.6 3.9  CL 101 102 106 107 106 106  CO2 23  --  22 22 25 25   GLUCOSE 114* 114* 201* 121* 102* 110*  BUN 17 22 20 19 13 13   CREATININE 1.18 1.20 1.08 1.05 0.89 0.99  CALCIUM 9.0  --  8.6* 9.0 9.1 8.9  MG  2.0  --   --  1.9 1.9 1.9  PHOS  --   --   --  3.4 4.3 3.5    GFR: Estimated Creatinine Clearance: 78 mL/min (by C-G formula based on SCr of 0.99 mg/dL). Recent Labs  Lab 03/22/21 1717 03/22/21 2046 03/23/21 0527 03/23/21 2325 03/25/21 0312 03/26/21 0115  PROCALCITON  --  0.15 0.13 0.12  --   --   WBC 22.4*  --  18.8* 20.6* 16.0* 15.2*  LATICACIDVEN 2.3* 1.8  --   --   --   --      Liver Function Tests: Recent Labs  Lab 03/22/21 1717 03/23/21 2325 03/25/21 0312 03/26/21 0115  AST 26 57* 64* 35  ALT 22 45* 75* 72*  ALKPHOS 75 51 59 52  BILITOT 1.5* 0.3 <0.1* 0.4  PROT 6.8 5.2* 5.4* 5.0*  ALBUMIN 3.4* 2.6* 2.9* 2.7*     Recent Labs  Lab 03/22/21 1717  LIPASE 33    No results for input(s): AMMONIA in the last 168 hours.  ABG    Component Value Date/Time   TCO2 26 03/22/2021 1732      Coagulation Profile: Recent Labs  Lab 03/22/21 1717  INR 1.1     Cardiac Enzymes: No results for input(s): CKTOTAL, CKMB, CKMBINDEX, TROPONINI in the last 168 hours.  HbA1C: Hgb A1c MFr Bld  Date/Time Value Ref Range Status  03/23/2021 11:25 PM 5.2 4.8 - 5.6 % Final    Comment:    (NOTE) Pre diabetes:          5.7%-6.4%  Diabetes:              >6.4%  Glycemic control for   <7.0% adults with diabetes     CBG: No results for input(s): GLUCAP in the last 168 hours.  Critical care time: n/a    Karren Burly, MD [facility] See

## 2021-03-26 NOTE — Progress Notes (Signed)
Received call from Nuclear Medicine. Was informed they will get patient tomorrow morning for scan. Will need to be NPO at MN and cannot receive any pain medications after MN.

## 2021-03-27 ENCOUNTER — Telehealth: Payer: Self-pay | Admitting: Acute Care

## 2021-03-27 ENCOUNTER — Inpatient Hospital Stay (HOSPITAL_COMMUNITY): Payer: Medicare Other

## 2021-03-27 ENCOUNTER — Other Ambulatory Visit (HOSPITAL_COMMUNITY): Payer: Self-pay

## 2021-03-27 LAB — COMPREHENSIVE METABOLIC PANEL
ALT: 82 U/L — ABNORMAL HIGH (ref 0–44)
AST: 33 U/L (ref 15–41)
Albumin: 2.7 g/dL — ABNORMAL LOW (ref 3.5–5.0)
Alkaline Phosphatase: 54 U/L (ref 38–126)
Anion gap: 10 (ref 5–15)
BUN: 16 mg/dL (ref 8–23)
CO2: 25 mmol/L (ref 22–32)
Calcium: 8.9 mg/dL (ref 8.9–10.3)
Chloride: 104 mmol/L (ref 98–111)
Creatinine, Ser: 0.91 mg/dL (ref 0.61–1.24)
GFR, Estimated: >60 mL/min (ref >60)
Glucose, Bld: 107 mg/dL — ABNORMAL HIGH (ref 70–99)
Potassium: 4.1 mmol/L (ref 3.5–5.1)
Sodium: 139 mmol/L (ref 135–145)
Total Bilirubin: 0.3 mg/dL (ref 0.3–1.2)
Total Protein: 5.1 g/dL — ABNORMAL LOW (ref 6.5–8.1)

## 2021-03-27 LAB — IRON AND TIBC
Iron: 41 ug/dL — ABNORMAL LOW (ref 45–182)
Saturation Ratios: 17 % — ABNORMAL LOW (ref 17.9–39.5)
TIBC: 246 ug/dL — ABNORMAL LOW (ref 250–450)
UIBC: 205 ug/dL

## 2021-03-27 LAB — CBC WITH DIFFERENTIAL/PLATELET
Abs Immature Granulocytes: 0.14 10*3/uL — ABNORMAL HIGH (ref 0.00–0.07)
Basophils Absolute: 0 10*3/uL (ref 0.0–0.1)
Basophils Relative: 0 %
Eosinophils Absolute: 0.1 10*3/uL (ref 0.0–0.5)
Eosinophils Relative: 1 %
HCT: 37 % — ABNORMAL LOW (ref 39.0–52.0)
Hemoglobin: 12.4 g/dL — ABNORMAL LOW (ref 13.0–17.0)
Immature Granulocytes: 1 %
Lymphocytes Relative: 26 %
Lymphs Abs: 4.3 10*3/uL — ABNORMAL HIGH (ref 0.7–4.0)
MCH: 33.7 pg (ref 26.0–34.0)
MCHC: 33.5 g/dL (ref 30.0–36.0)
MCV: 100.5 fL — ABNORMAL HIGH (ref 80.0–100.0)
Monocytes Absolute: 1.2 10*3/uL — ABNORMAL HIGH (ref 0.1–1.0)
Monocytes Relative: 7 %
Neutro Abs: 10.6 10*3/uL — ABNORMAL HIGH (ref 1.7–7.7)
Neutrophils Relative %: 65 %
Platelets: 324 10*3/uL (ref 150–400)
RBC: 3.68 MIL/uL — ABNORMAL LOW (ref 4.22–5.81)
RDW: 12.9 % (ref 11.5–15.5)
WBC: 16.4 10*3/uL — ABNORMAL HIGH (ref 4.0–10.5)
nRBC: 0 % (ref 0.0–0.2)

## 2021-03-27 LAB — RETICULOCYTES
Immature Retic Fract: 11.8 % (ref 2.3–15.9)
RBC.: 3.69 MIL/uL — ABNORMAL LOW (ref 4.22–5.81)
Retic Count, Absolute: 52.8 10*3/uL (ref 19.0–186.0)
Retic Ct Pct: 1.4 % (ref 0.4–3.1)

## 2021-03-27 LAB — CULTURE, BLOOD (ROUTINE X 2)
Culture: NO GROWTH
Culture: NO GROWTH
Special Requests: ADEQUATE

## 2021-03-27 LAB — FOLATE: Folate: 11.4 ng/mL (ref >5.9)

## 2021-03-27 LAB — MAGNESIUM: Magnesium: 2 mg/dL (ref 1.7–2.4)

## 2021-03-27 LAB — PHOSPHORUS: Phosphorus: 4.3 mg/dL (ref 2.5–4.6)

## 2021-03-27 LAB — FERRITIN: Ferritin: 159 ng/mL (ref 24–336)

## 2021-03-27 LAB — VITAMIN B12: Vitamin B-12: 244 pg/mL (ref 180–914)

## 2021-03-27 MED ORDER — NICOTINE 21 MG/24HR TD PT24
21.0000 mg | MEDICATED_PATCH | Freq: Every day | TRANSDERMAL | 0 refills | Status: DC
  Filled 2021-03-27: qty 28, 28d supply, fill #0

## 2021-03-27 MED ORDER — AMIODARONE HCL 200 MG PO TABS
200.0000 mg | ORAL_TABLET | Freq: Two times a day (BID) | ORAL | 0 refills | Status: DC
Start: 2021-03-27 — End: 2022-10-18
  Filled 2021-03-27: qty 60, 30d supply, fill #0

## 2021-03-27 MED ORDER — PREDNISONE 20 MG PO TABS
20.0000 mg | ORAL_TABLET | Freq: Every day | ORAL | 0 refills | Status: AC
  Filled 2021-03-27: qty 1, 1d supply, fill #0

## 2021-03-27 MED ORDER — TECHNETIUM TC 99M MEBROFENIN IV KIT
5.1000 | PACK | Freq: Once | INTRAVENOUS | Status: AC | PRN
  Administered 2021-03-27: 5.1 via INTRAVENOUS

## 2021-03-27 MED ORDER — FLUTICASONE FUROATE-VILANTEROL 200-25 MCG/ACT IN AEPB
1.0000 | INHALATION_SPRAY | Freq: Every day | RESPIRATORY_TRACT | 0 refills | Status: AC
  Filled 2021-03-27: qty 60, 30d supply, fill #0

## 2021-03-27 MED ORDER — ACETAMINOPHEN 325 MG PO TABS
650.0000 mg | ORAL_TABLET | ORAL | 0 refills | Status: AC | PRN
  Filled 2021-03-27: qty 20, 2d supply, fill #0

## 2021-03-27 NOTE — TOC Transition Note (Signed)
Transition of Care [facility]) - CM/SW Discharge Note   Patient Details  Name: Jack Barber MRN: [id number] Date of Birth: 03-11-1955  Transition of Care [facility]) CM/SW Contact:  Leone Haven, RN Phone Number: 03/27/2021, 3:08 PM   Clinical Narrative:    NCM tried to make follow up apt for patient, he states he does not have a way to get to his apts.  NCM asked if he could take the bus to apts, he states Mam, have you ever been to [place].  He said he will call his PCP to make follow up himself.  NCM gave the secretary at the PCP office patient's address we have listed on file ,but she states she can not change his address until he comes in to the office.  NCM will ast patient with transportation to get him today by cab voucher which was approved by supervisor the other day.           Patient Goals and CMS Choice        Discharge Placement                       Discharge Plan and Services                                     Social Determinants of Health (SDOH) Interventions     Readmission Risk Interventions No flowsheet data found.

## 2021-03-27 NOTE — Care Management Important Message (Signed)
Important Message  Patient Details  Name: Jack Barber MRN: [id number] Date of Birth: 1955-07-21   Medicare Important Message Given:  Yes     Shelda Altes 03/27/2021, 8:31 AM

## 2021-03-27 NOTE — Progress Notes (Signed)
HIDA negative for acute cholecystitis, no indication for urgent/emergent laparoscopic cholecystectomy. Elevated LFTs and US findings likely reactive to other acute medical issues. General surgery will not see while inpatient, please call if we can be of further assistance.   Juliet Rude, [facility] 03/27/2021, 12:39 PM Please see Amion for pager number during day hours 7:00am-4:30pm

## 2021-03-27 NOTE — Plan of Care (Signed)

## 2021-03-27 NOTE — Progress Notes (Signed)
SATURATION QUALIFICATIONS: (This note is used to comply with regulatory documentation for home oxygen)  Patient Saturations on Room Air at Rest = 93%  Patient Saturations on Room Air while Ambulating = 91%  Patient Saturations on N/A Liters of oxygen while Ambulating = N/A%  Please briefly explain why patient needs home oxygen: N/A  Jack [facility] Phone: [phone] Office Phone: [phone]

## 2021-03-27 NOTE — Progress Notes (Addendum)
Progress Note  Patient Name: Jack Barber Date of Encounter: 03/27/2021  [facility] Cardiologist: None   Subjective   Denies any CP or SOB. Just came back from HIDA scan  Inpatient Medications    Scheduled Meds:  amiodarone  400 mg Oral BID   arformoterol  15 mcg Nebulization BID   budesonide (PULMICORT) nebulizer solution  0.5 mg Nebulization BID   doxycycline  100 mg Oral Q12H   fluticasone furoate-vilanterol  1 puff Inhalation Daily   nicotine  21 mg Transdermal Daily   predniSONE  20 mg Oral Q breakfast   rivaroxaban  20 mg Oral Q supper   Continuous Infusions:  PRN Meds: acetaminophen, albuterol, ondansetron (ZOFRAN) IV, traZODone   Vital Signs    Vitals:   03/26/21 2005 03/26/21 2046 03/27/21 0008 03/27/21 0413  BP: 125/67  125/63 140/76  Pulse: (!) 59  (!) 58 (!) 53  Resp: (!) 21  (!) 22 18  Temp: 98 F (36.7 C)  97.8 F (36.6 C) 97.7 F (36.5 C)  TempSrc: Oral  Oral Oral  SpO2: 96% 95% 94% 97%  Weight:    85 kg  Height:        Intake/Output Summary (Last 24 hours) at 03/27/2021 0945 Last data filed at 03/27/2021 0700 Gross per 24 hour  Intake 780 ml  Output 2525 ml  Net -1745 ml   Last 3 Weights 03/27/2021 03/26/2021 03/25/2021  Weight (lbs) 187 lb 6.3 oz 187 lb 14.4 oz 188 lb 8 oz  Weight (kg) 85 kg 85.231 kg 85.503 kg      Telemetry    Sinus bradycardia with HR low 50s - Personally Reviewed  ECG    Sinus bradycardia with RBBB - Personally Reviewed  Physical Exam   GEN: No acute distress.   Neck: No JVD Cardiac: RRR, no murmurs, rubs, or gallops.  Respiratory: Clear to auscultation bilaterally. GI: Soft, nontender, non-distended  MS: No edema; No deformity. Neuro:  Nonfocal  Psych: Normal affect   Labs    High Sensitivity Troponin:   Recent Labs  Lab 03/22/21 1717 03/22/21 2046  TROPONINIHS 18* 13     Chemistry Recent Labs  Lab 03/25/21 0312 03/26/21 0115 03/27/21 0319  NA 140 138 139  K 3.6 3.9 4.1  CL 106  106 104  CO2 25 25 25   GLUCOSE 102* 110* 107*  BUN 13 13 16   CREATININE 0.89 0.99 0.91  CALCIUM 9.1 8.9 8.9  MG 1.9 1.9 2.0  PROT 5.4* 5.0* 5.1*  ALBUMIN 2.9* 2.7* 2.7*  AST 64* 35 33  ALT 75* 72* 82*  ALKPHOS 59 52 54  BILITOT <0.1* 0.4 0.3  GFRNONAA >60 >60 >60  ANIONGAP 9 7 10     Lipids  Recent Labs  Lab 03/23/21 0527  CHOL 100  TRIG 57  HDL 22*  LDLCALC 67  CHOLHDL 4.5    Hematology Recent Labs  Lab 03/25/21 0312 03/26/21 0115 03/27/21 0319  WBC 16.0* 15.2* 16.4*  RBC 3.67* 3.61* 3.68*   3.69*  HGB 12.4* 12.1* 12.4*  HCT 36.5* 35.8* 37.0*  MCV 99.5 99.2 100.5*  MCH 33.8 33.5 33.7  MCHC 34.0 33.8 33.5  RDW 12.7 12.8 12.9  PLT 333 319 324   Thyroid  Recent Labs  Lab 03/22/21 2046  TSH 0.413    BNP Recent Labs  Lab 03/22/21 1717  BNP 242.2*    DDimer No results for input(s): DDIMER in the last 168 hours.   Radiology  US Abdomen Limited RUQ (LIVER/GB)  Result Date: 03/25/2021 CLINICAL DATA:  Abnormal LFTs. EXAM: ULTRASOUND ABDOMEN LIMITED RIGHT UPPER QUADRANT COMPARISON:  None. FINDINGS: Gallbladder: There is a thickened free wall measuring 6.7 mm with pericholecystic fluid and gallbladder sludge, without shadowing stones. There is no positive sonographic Murphy's sign. Common bile duct: Diameter: 3.4 mm, with no intrahepatic biliary prominence. Liver: No focal lesion identified. Within normal limits in parenchymal echogenicity. Portal vein is patent on color Doppler imaging with normal direction of blood flow towards the liver. Other: None. IMPRESSION: Thickened gallbladder wall with pericholecystic fluid, sludge without shadowing stones. Differential diagnosis in the absence of positive sonographic Murphy sign is chronic cholecystitis, pain medicated acute acalculous cholecystitis, congestive or reactive wall thickening, and infiltrating disease. Electronically Signed   By: Jack Barber M.D.   On: 03/25/2021 22:04    Cardiac Studies   Echo  03/23/2021  1. Left ventricular ejection fraction, by estimation, is 55 to 60%. The  left ventricle has normal function. The left ventricle has no regional  wall motion abnormalities. Left ventricular diastolic parameters are  consistent with Grade II diastolic  dysfunction (pseudonormalization).   2. Right ventricular systolic function is normal. The right ventricular  size is normal. There is mildly elevated pulmonary artery systolic  pressure. The estimated right ventricular systolic pressure is [id number] mmHg.   3. The mitral valve is normal in structure. Mild mitral valve  regurgitation. No evidence of mitral stenosis.   4. Tricuspid valve regurgitation is moderate.   5. The aortic valve is calcified. There is mild calcification of the  aortic valve. Aortic valve regurgitation is not visualized. Aortic valve  sclerosis is present, with no evidence of aortic valve stenosis.   6. The inferior vena cava is dilated in size with <50% respiratory  variability, suggesting right atrial pressure of 15 mmHg.   Patient Profile     66 y.o. male with PMH of tobacco and EtOH abuse presented with 4-5 days of "fluid in his chest", feeling hot and cold. On arrival, noted to be in 2:1 atrial flutter with RVR and RBBB. Started on IV amiodarone. WBC 20.4, lactic acid 2.2. Atrial flutter was in the setting of possible infection.   Assessment & Plan    Atrial flutter with RVR             - CHA2DS2-Vasc score 1 (age >81)             - in the setting of possible infection. Cocaine +. TSH normal. Echo EF normal.             - converted to NSR on IV amiodarone. Do not recommend amiodarone long term given relative young age. May consider aflutter ablation as outpatient once seen by EP as outpatient             - IV heparin switched to Xarelto, will need NOAC for at least 4 weeks in the setting of CHA2DS2-Vasc score 1.              - LFT mildly elevated after starting on amiodarone, but now trending down again.               - patient given 1 month bottle of Xarelto by Jack Barber will be discontinued after finishing this bottle. Plan for 1 month of amiodarone at 200mg  BID upon discharge, then stop amiodarone after that as well. If recurrence of atrial flutter, will plan for EP study and ablation as outpatient.  Hopefully less recurrence of afib once hypoxia improve. Follow up with cardiology in 1 month.    Hypoxia: due to possible underlying infection. CXR clear, but patient has oxygen requirement that is disproportionate to the degree of afib. Patient did receive steroid therapy en route to the hospital.    Elevated LFT: pending HIDA scan today, if positive, primary team plan to consult Gen Surgery.  EtOH abuse   Tobacco abuse       For questions or updates, please contact [place] Please consult [email or web address] for contact info under        Signed, Almyra Deforest, [place]  03/27/2021, 9:45 AM    Personally seen and examined. Agree with above.  Pleasant 66 year old with paroxysmal atrial flutter currently on amiodarone as well as Xarelto for 1 month given his CHA2DS2-VASc score of 1 for age greater than 62.  Social determinants of health are important for him.  He is currently renting a room in [facility].  He was worried about wood-burning stove to heat the house causing some respiratory problems.  Prior to that he was living in his car.  Atrial flutter - We will give him amiodarone 200 mg twice a day to take for 30 days.  After that, remember the half-life of amiodarone is quite long.  Hopefully since he is over his respiratory illness we will not see another episode of atrial fibrillation or flutter in the future.  If this does occur, we could always consider ablative efforts if possible. -We will anticoagulate him empirically for 1 month since he just recently converted on his own.  He does not warrant lifelong anticoagulation at this point based upon his scoring.  Elevated  LFTs - HIDA scan has been performed.  Primary team following.  We will go ahead and sign off.  Please let us know if we can be of further assistance.  Follow-up with Dr. Phineas Inches on March 27 at 10 AM.  Candee Furbish, MD

## 2021-03-27 NOTE — Progress Notes (Signed)
I have contacted the clinic to set up a new patient appointment with our pulmonary team upon discharge   Jack Barber D. Tiburcio Pea, NP-C  Pulmonary & Critical Care Personal contact information can be found on Amion  03/27/2021, 3:47 PM

## 2021-03-27 NOTE — Telephone Encounter (Signed)
I have contacted the clinic to set up a new patient appointment with our pulmonary team upon discharge °  °Surena Welge D. Harris, NP-C °[facility] °Personal contact information can be found on Amion  °03/27/2021, 3:47 PM °

## 2021-03-27 NOTE — Progress Notes (Signed)
Mobility Specialist Progress Note:   03/27/21 1500  Mobility  Activity Ambulated with assistance in hallway  Level of Assistance Standby assist, set-up cues, supervision of patient - no hands on  Assistive Device Front wheel walker  Distance Ambulated (ft) 100 ft  Activity Response Tolerated well  $Mobility charge 1 Mobility   Pt asx during ambulation. Left sitting EOB with MD present. Ambulatory sat note to follow.  Addison Lank Acute Rehab Phone: [phone] Office Phone: [phone]

## 2021-03-27 NOTE — Discharge Summary (Signed)
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 pressure. The tricuspid regurgitant velocity is 2.49  m/s, and with an assumed right atrial pressure of 15 mmHg, the estimated right ventricular systolic pressure is [id number]. Left Atrium: Left atrial size was normal in size. Right Atrium: Right atrial size was normal in size. Pericardium: There is no evidence of pericardial effusion. Mitral Valve: The mitral valve is normal in structure. Mild mitral valve regurgitation. No evidence of mitral valve stenosis. Tricuspid Valve: The tricuspid valve is normal in structure. Tricuspid valve regurgitation is moderate . No evidence of tricuspid stenosis. Aortic Valve: The aortic valve is calcified. There is mild calcification of the aortic valve. Aortic valve regurgitation is not visualized. Aortic valve sclerosis is present, with no evidence of aortic valve stenosis. Aortic valve peak gradient measures 10.5 mmHg. Pulmonic Valve: The pulmonic valve  was normal in structure. Pulmonic valve regurgitation is not visualized. No evidence of pulmonic stenosis. Aorta: The aortic root is normal in size and structure.  Venous: The inferior vena cava is dilated in size with less than 50% respiratory variability, suggesting right atrial pressure of 15 mmHg. IAS/Shunts: No atrial level shunt detected by color flow Doppler.  LEFT VENTRICLE PLAX 2D LVIDd:         5.00 cm      Diastology LVIDs:         3.20 cm      LV e' medial:    7.18 cm/s LV PW:         1.00 cm      LV E/e' medial:  9.3 LV IVS:        0.90 cm      LV e' lateral:   8.81 cm/s LVOT diam:     2.00 cm      LV E/e' lateral: 7.6 LV SV:         55 LV SV Index:   28 LVOT Area:     3.14 cm  LV Volumes (MOD) LV vol d, MOD A2C: 132.0 ml LV vol d, MOD A4C: 132.0 ml LV vol s, MOD A2C: 68.7 ml LV vol s, MOD A4C: 67.4 ml LV SV MOD A2C:     63.3 ml LV SV MOD A4C:     132.0 ml LV SV MOD BP:      63.5 ml RIGHT VENTRICLE            IVC RV Basal diam:  3.50 cm    IVC diam: 2.40 cm RV Mid diam:    2.20 cm RV S prime:     8.49 cm/s TAPSE (M-mode): 2.3 cm LEFT ATRIUM             Index LA diam:        3.90 cm 2.00 cm/m LA Vol (A2C):   45.4 ml 23.29 ml/m LA Vol (A4C):   35.3 ml 18.11 ml/m LA Biplane Vol: 41.6 ml 21.34 ml/m  AORTIC VALVE AV Area (Vmax): 1.49 cm AV Vmax:        162.00 cm/s AV Peak Grad:   10.5 mmHg LVOT Vmax:      76.90 cm/s LVOT Vmean:     60.000 cm/s LVOT VTI:       0.174 m  AORTA Ao Root diam: 2.80 cm MITRAL VALVE               TRICUSPID VALVE MV Area (PHT): 5.84 cm    TR Peak grad:   24.8 mmHg MV Decel Time: 130 msec    TR Vmax:        249.00 cm/s MV E velocity: 66.90 cm/s MV A velocity: 48.50 cm/s  SHUNTS MV E/A ratio:  1.38        Systemic VTI:  0.17 m                            Systemic Diam: 2.00 cm Candee Furbish MD Electronically signed by Candee Furbish MD Signature Date/Time: 03/23/2021/4:14:48 PM    Final    US Abdomen Limited RUQ (LIVER/GB)  Result Date: 03/25/2021 CLINICAL DATA:  Abnormal LFTs. EXAM: ULTRASOUND ABDOMEN LIMITED RIGHT UPPER QUADRANT COMPARISON:  None. FINDINGS: Gallbladder: There is a thickened free wall measuring 6.7 mm with pericholecystic  fluid and gallbladder sludge, without shadowing stones. There is no positive sonographic Murphy's sign. Common  bile duct: Diameter: 3.4 mm, with no intrahepatic biliary prominence. Liver: No focal lesion identified. Within normal limits in parenchymal echogenicity. Portal vein is patent on color Doppler imaging with normal direction of blood flow towards the liver. Other: None. IMPRESSION: Thickened gallbladder wall with pericholecystic fluid, sludge without shadowing stones. Differential diagnosis in the absence of positive sonographic Murphy sign is chronic cholecystitis, pain medicated acute acalculous cholecystitis, congestive or reactive wall thickening, and infiltrating disease. Electronically Signed   By: Telford Nab M.D.   On: 03/25/2021 22:04    Microbiology: Results for orders placed or performed during the hospital encounter of 03/22/21  Blood Culture (routine x 2)     Status: None   Collection Time: 03/22/21  5:05 PM   Specimen: BLOOD  Result Value Ref Range Status   Specimen Description BLOOD BLOOD LEFT FOREARM  Final   Special Requests   Final    BOTTLES DRAWN AEROBIC AND ANAEROBIC Blood Culture adequate volume   Culture   Final    NO GROWTH 5 DAYS Performed at [facility], [place] [address] [place], [place] [address]    Report Status 03/27/2021 FINAL  Final  Blood Culture (routine x 2)     Status: None   Collection Time: 03/22/21  5:17 PM   Specimen: BLOOD  Result Value Ref Range Status   Specimen Description BLOOD RIGHT ANTECUBITAL  Final   Special Requests   Final    BOTTLES DRAWN AEROBIC AND ANAEROBIC Blood Culture results may not be optimal due to an inadequate volume of blood received in culture bottles   Culture   Final    NO GROWTH 5 DAYS Performed at [facility], [place] [address] [place], [place] [address]    Report Status 03/27/2021 FINAL  Final  Resp Panel by RT-PCR (Flu A&B, Covid) Nasopharyngeal Swab     Status: None   Collection Time: 03/22/21   5:24 PM   Specimen: Nasopharyngeal Swab; Nasopharyngeal(NP) swabs in vial transport medium  Result Value Ref Range Status   SARS Coronavirus 2 by RT PCR NEGATIVE NEGATIVE Final    Comment: (NOTE) SARS-CoV-2 target nucleic acids are NOT DETECTED.  The SARS-CoV-2 RNA is generally detectable in upper respiratory specimens during the acute phase of infection. The lowest concentration of SARS-CoV-2 viral copies this assay can detect is 138 copies/mL. A negative result does not preclude SARS-Cov-2 infection and should not be used as the sole basis for treatment or other patient management decisions. A negative result may occur with  improper specimen collection/handling, submission of specimen other than nasopharyngeal swab, presence of viral mutation(s) within the areas targeted by this assay, and inadequate number of viral copies(<138 copies/mL). A negative result must be combined with clinical observations, patient history, and epidemiological information. The expected result is Negative.  Fact Sheet for Patients:  EntrepreneurPulse.com.au  Fact Sheet for Healthcare Providers:  IncredibleEmployment.be  This test is no t yet approved or cleared by the [place] FDA and  has been authorized for detection and/or diagnosis of SARS-CoV-2 by FDA under an Emergency Use Authorization (EUA). This EUA will remain  in effect (meaning this test can be used) for the duration of the COVID-19 declaration under Section 564(b)(1) of the Act, 21 U.S.C.section 360bbb-3(b)(1), unless the authorization is terminated  or revoked sooner.       Influenza A by PCR NEGATIVE NEGATIVE Final   Influenza B by PCR NEGATIVE NEGATIVE Final    Comment: (NOTE) The Xpert Xpress SARS-CoV-2/FLU/RSV  plus assay is intended as an aid in the diagnosis of influenza from Nasopharyngeal swab specimens and should not be used as a sole basis for treatment. Nasal washings and aspirates  are unacceptable for Xpert Xpress SARS-CoV-2/FLU/RSV testing.  Fact Sheet for Patients: EntrepreneurPulse.com.au  Fact Sheet for Healthcare Providers: IncredibleEmployment.be  This test is not yet approved or cleared by the [place] FDA and has been authorized for detection and/or diagnosis of SARS-CoV-2 by FDA under an Emergency Use Authorization (EUA). This EUA will remain in effect (meaning this test can be used) for the duration of the COVID-19 declaration under Section 564(b)(1) of the Act, 21 U.S.C. section 360bbb-3(b)(1), unless the authorization is terminated or revoked.  Performed at [facility], [place] [address] [place], [place] [address]   Urine Culture     Status: Abnormal   Collection Time: 03/22/21  8:32 PM   Specimen: In/Out Cath Urine  Result Value Ref Range Status   Specimen Description IN/OUT CATH URINE  Final   Special Requests   Final    NONE Performed at [facility], [place] [address], [place], [place] [address]    Culture MULTIPLE SPECIES PRESENT, SUGGEST RECOLLECTION (A)  Final   Report Status 03/24/2021 FINAL  Final  MRSA Next Gen by PCR, Nasal     Status: None   Collection Time: 03/22/21 10:49 PM   Specimen: Nasal Mucosa; Nasal Swab  Result Value Ref Range Status   MRSA by PCR Next Gen NOT DETECTED NOT DETECTED Final    Comment: (NOTE) The GeneXpert MRSA Assay (FDA approved for NASAL specimens only), is one component of a comprehensive MRSA colonization surveillance program. It is not intended to diagnose MRSA infection nor to guide or monitor treatment for MRSA infections. Test performance is not FDA approved in patients less than 41 years old. Performed at [facility], [place] [address] [place], [place] [address]     Labs: CBC: Recent Labs  Lab 03/22/21 1717 03/22/21 1732 03/23/21 0527 03/23/21 2325 03/25/21 0312 03/26/21 0115 03/27/21 0319  WBC 22.4*  --  18.8* 20.6* 16.0*  15.2* 16.4*  NEUTROABS 18.1*  --   --  14.7* 10.2* 9.6* 10.6*  HGB 17.4*   < > 13.3 12.7* 12.4* 12.1* 12.4*  HCT 51.5   < > 39.8 37.7* 36.5* 35.8* 37.0*  MCV 100.0  --  98.0 99.5 99.5 99.2 100.5*  PLT 356  --  313 268 333 319 324   < > = values in this interval not displayed.   Basic Metabolic Panel: Recent Labs  Lab 03/22/21 1717 03/22/21 1732 03/23/21 0527 03/23/21 2325 03/25/21 0312 03/26/21 0115 03/27/21 0319  NA 137   < > 136 137 140 138 139  K 3.8   < > 4.2 4.0 3.6 3.9 4.1  CL 101   < > 106 107 106 106 104  CO2 23  --  22 22 25 25 25   GLUCOSE 114*   < > 201* 121* 102* 110* 107*  BUN 17   < > 20 19 13 13 16   CREATININE 1.18   < > 1.08 1.05 0.89 0.99 0.91  CALCIUM 9.0  --  8.6* 9.0 9.1 8.9 8.9  MG 2.0  --   --  1.9 1.9 1.9 2.0  PHOS  --   --   --  3.4 4.3 3.5 4.3   < > = values in this interval not displayed.   Liver Function Tests: Recent Labs  Lab 03/22/21 1717 03/23/21 2325 03/25/21 0312 03/26/21 0115 03/27/21 0319  AST 26 57* 64* 35 33  ALT 22 45* 75* 72* 82*  ALKPHOS 75 51 59 52 54  BILITOT 1.5* 0.3 <0.1* 0.4 0.3  PROT 6.8 5.2* 5.4* 5.0* 5.1*  ALBUMIN 3.4* 2.6* 2.9* 2.7* 2.7*   CBG: No results for input(s): GLUCAP in the last 168 hours.  Discharge time spent: greater than 30 minutes.  Signed: Raiford Noble, DO [facility] 03/27/2021

## 2021-03-28 ENCOUNTER — Telehealth (HOSPITAL_COMMUNITY): Payer: Self-pay

## 2021-03-28 ENCOUNTER — Other Ambulatory Visit (HOSPITAL_COMMUNITY): Payer: Self-pay

## 2021-03-28 NOTE — Telephone Encounter (Signed)
Patient is scheduled on 04/12/2021 at 2:30pm with Dr. Everardo All.  ?

## 2021-03-28 NOTE — Telephone Encounter (Signed)
Pharmacy Transitions of Care Follow-up Telephone Call ? ?Date of discharge: 03/27/21  ?Discharge Diagnosis: Atrial flutter with rapid ventricular response  ? ?How have you been since you were released from the hospital? Patient doing well. Wanted to go over his medications  ? ?Medication changes made at discharge: ?START taking these medications ? ?START taking these medications  ?acetaminophen 325 MG tablet ?Commonly known as: TYLENOL ?Take 2 tablets (650 mg total) by mouth every 4 (four) hours as needed for headache or mild pain.  ?amiodarone 200 MG tablet ?Commonly known as: PACERONE ?Take 1 tablet (200 mg total) by mouth 2 (two) times daily.  ?Breo Ellipta 200-25 MCG/ACT Aepb ?Generic drug: fluticasone furoate-vilanterol ?Inhale 1 puff into the lungs daily.  ?nicotine 21 mg/24hr patch ?Commonly known as: NICODERM CQ - dosed in mg/24 hours ?Place 1 patch (21 mg total) onto the skin daily.  ?predniSONE 20 MG tablet ?Commonly known as: DELTASONE ?Take 1 tablet (20 mg total) by mouth daily with breakfast for 1 day.  ?Xarelto 20 MG Tabs tablet ?Generic drug: rivaroxaban ?Take 1 tablet (20 mg total) by mouth daily with supper.  ? ? ?Medication changes verified by the patient? Yes ?  ? ?Medication Accessibility: ? ?Home Pharmacy: [facility]  ? ?Was the patient provided with refills on discharged medications? no  ? ?Have all prescriptions been transferred from [facility] to home pharmacy? N/a  ? ?Medication Review: ? ?RIVAROXABAN (XARELTO)  ?Rivaroxaban  20 mg daily  ?- Discussed importance of taking medication with food and around the same time everyday  ?- Reviewed potential DDIs with patient  ?- Advised patient of medications to avoid (NSAIDs, ASA)  ?- Educated that Tylenol (acetaminophen) will be the preferred analgesic to prevent risk of bleeding  ?- Emphasized importance of monitoring for signs and symptoms of bleeding (abnormal bruising, prolonged bleeding, nose bleeds, bleeding from gums, discolored urine, black tarry  stools)  ?- Advised patient to alert all providers of anticoagulation therapy prior to starting a new medication or having a procedure  ? ?Follow-up Appointments: ? Date Visit Type Length Department  ? 04/12/2021  2:30 PM HOSPITAL FU 30 30 min [facility] [id number]  ?Patient Instructions:   ?  ?      ? 04/23/2021 10:00 AM OFFICE VISIT 20 min [facility] [id number]  ? ? ?If their condition worsens, is the pt aware to call PCP or go to the Emergency Dept.?  ?Yes ? ?Final Patient Assessment: ?Patient doing well. Understands how to take all medications ? ?Darcus Austin, PharmD ?Clinical Pharmacist ?[facility] ?03/28/2021 12:08 PM ? ?

## 2021-03-29 DIAGNOSIS — Z8679 Personal history of other diseases of the circulatory system: Secondary | ICD-10-CM

## 2021-04-12 ENCOUNTER — Inpatient Hospital Stay: Payer: Medicare Other | Admitting: Pulmonary Disease

## 2021-04-12 NOTE — Progress Notes (Deleted)
? ? ?Subjective:  ? ?PATIENT ID: Jack Barber GENDER: male DOB: 1955-10-18, MRN: [id number] ? ? ?HPI ? ?No chief complaint on file. ? ? ?Reason for Visit: ***Follow-up ***New consult for *** ? ?[name] is a 66 year old male active smoker with atrial flutter on anticoagulation who presents for hospital follow-up. ? ?He was hospitalized from 2/23 to 03/27/2021 for acute hypoxemic respiratory failure secondary to presumed COPD exacerbation and new onset A-fib with RVR.  Pulmonary was consulted and he was treated with nebulizers and prednisone.  He was weaned off of oxygen prior to discharge.  Consult team recommended starting ICS/LABA.  He was arranged for pulmonary follow-up.  Patient reports history of scarring in his lungs. ?*** ? ?Social History: ?Active smoker.  1 pack/day ?Daily drinker ?Previously worked at [employer] x14 years with hospitalizations related to exposures in the past ? ?Environmental exposures: *** ? ?I have personally reviewed patient's past medical/family/social history, allergies, current medications.*** ? ?Past Medical History:  ?Diagnosis Date  ? Alcohol abuse   ? Depression   ? Tobacco abuse   ?  ? ?Family History  ?Problem Relation Age of Onset  ? Heart attack Father   ? Heart attack Brother   ?  ? ?Social History  ? ?Occupational History  ? Not on file  ?Tobacco Use  ? Smoking status: Every Day  ?  Packs/day: 1.00  ?  Types: Cigarettes  ? Smokeless tobacco: Not on file  ?Substance and Sexual Activity  ? Alcohol use: Yes  ?  Alcohol/week: 1.0 standard drink  ?  Types: 1 Cans of beer per week  ? Drug use: Not on file  ? Sexual activity: Not on file  ? ? ?Allergies  ?Allergen Reactions  ? Codeine Itching  ?  ? ?Outpatient Medications Prior to Visit  ?Medication Sig Dispense Refill  ? acetaminophen (TYLENOL) 325 MG tablet Take 2 tablets (650 mg total) by mouth every 4 (four) hours as needed for headache or mild pain. 20 tablet 0  ? amiodarone (PACERONE) 200 MG tablet  Take 1 tablet (200 mg total) by mouth 2 (two) times daily. 60 tablet 0  ? fluticasone furoate-vilanterol (BREO ELLIPTA) 200-25 MCG/ACT AEPB Inhale 1 puff into the lungs daily. 60 each 0  ? nicotine (NICODERM CQ - DOSED IN MG/24 HOURS) 21 mg/24hr patch Place 1 patch (21 mg total) onto the skin daily. 28 patch 0  ? rivaroxaban (XARELTO) 20 MG TABS tablet Take 1 tablet (20 mg total) by mouth daily with supper. 30 tablet 0  ? ?No facility-administered medications prior to visit.  ? ? ?ROS ? ? ?Objective:  ?There were no vitals filed for this visit. ?  ? ?Physical Exam: ?General: Well-appearing, no acute distress ?HENT: [place], AT ?Eyes: EOMI, no scleral icterus ?Respiratory: Clear to auscultation bilaterally.  No crackles, wheezing or rales ?Cardiovascular: RRR, -M/R/G, no JVD ?Extremities:-Edema,-tenderness ?Neuro: AAO x4, CNII-XII grossly intact ?Psych: Normal mood, normal affect ? ?Data Reviewed: ? ?Imaging: ?CXR 03/25/2021-no infiltrate, effusion or edema.  Resolved right basilar opacity ? ?PFT: ?None on file ? ?Cardiac: ?Echo 03/23/2021-EF 55 to 60%.  No WMA.  Grade 2 DD.  RV function and size normal.  Mildly elevated PA SP.  RVSP 39.8 normal mitral valve.  Moderate tricuspid regurg.  Calcified aortic valve ? ?Labs: ?CBC ?   ?Component Value Date/Time  ? WBC 16.4 (H) 03/27/2021 0319  ? RBC 3.68 (L) 03/27/2021 0319  ? RBC 3.69 (L) 03/27/2021 0319  ? HGB  12.4 (L) 03/27/2021 0319  ? HCT 37.0 (L) 03/27/2021 0319  ? PLT 324 03/27/2021 0319  ? MCV 100.5 (H) 03/27/2021 0319  ? MCH 33.7 03/27/2021 0319  ? MCHC 33.5 03/27/2021 0319  ? RDW 12.9 03/27/2021 0319  ? LYMPHSABS 4.3 (H) 03/27/2021 0319  ? MONOABS 1.2 (H) 03/27/2021 0319  ? EOSABS 0.1 03/27/2021 0319  ? BASOSABS 0.0 03/27/2021 0319  ? ? ? ?   ?Assessment & Plan:  ? ?Discussion: ?66 year old male active smoker with atrial flutter on anticoagulation who presents for hospital follow-up. ? ?Health Maintenance ? ?There is no immunization history on file for this patient. ?CT  Lung Screen*** ? ?No orders of the defined types were placed in this encounter. ?No orders of the defined types were placed in this encounter. ? ? ?No follow-ups on file. ? ?I have spent a total time of***-minutes on the day of the appointment reviewing prior documentation, coordinating care and discussing medical diagnosis and plan with the patient/family. Imaging, labs and tests included in this note have been reviewed and interpreted independently by me. ? ?Venetia Prewitt Rodman Pickle, MD ?[facility] ?04/12/2021 11:21 AM  ?Office Number [phone] ? ? ?

## 2021-04-23 ENCOUNTER — Ambulatory Visit: Payer: Medicare Other | Admitting: Internal Medicine

## 2021-04-23 NOTE — Progress Notes (Deleted)
?Cardiology Office Note:   ? ?Date:  04/23/2021  ? ?ID:  SHARON STAPEL, DOB 01-13-1956, MRN [id number] ? ?PCP:  Mick Sell, MD ?  ?[facility] Providers ?Cardiologist:  None { ?Click to update primary MD,subspecialty MD or APP then REFRESH:1}   ? ?Referring MD: Mick Sell, MD  ? ?No chief complaint on file. ? Atrial Flutter ? ?History of Present Illness:   ? ?Jack Barber is a 66 y.o. male with a hx of etoh abuse/cocaine, home insecurity, referral for hospital FU with atrial flutter , Chads1vasc=1 ? ? ?Hospital admission 2/232023-03/27/2021. He presented with hypoxia, managed for CAP. He was managed with duonebs and prednisone. He developed atrial flutter managed with amoidarone. He chemically converted.  ? ?EKG 03/26/2021- sinus brady, PACs, RBBB ?EKG 03/22/2021- 2:1 atrial flutter , rate 156 bpm ? ?Cardiology Studies ?TTE- EF 55%, LA size normal ? ? ? ?Past Medical History:  ?Diagnosis Date  ? Alcohol abuse   ? Depression   ? Tobacco abuse   ? ? ?No past surgical history on file. ? ?Current Medications: ?No outpatient medications have been marked as taking for the 04/23/21 encounter (Appointment) with Maisie Fus, MD.  ?  ? ?Allergies:   Codeine  ? ?Social History  ? ?Socioeconomic History  ? Marital status: Single  ?  Spouse name: Not on file  ? Number of children: Not on file  ? Years of education: Not on file  ? Highest education level: Not on file  ?Occupational History  ? Not on file  ?Tobacco Use  ? Smoking status: Every Day  ?  Packs/day: 1.00  ?  Types: Cigarettes  ? Smokeless tobacco: Not on file  ?Substance and Sexual Activity  ? Alcohol use: Yes  ?  Alcohol/week: 1.0 standard drink  ?  Types: 1 Cans of beer per week  ? Drug use: Not on file  ? Sexual activity: Not on file  ?Other Topics Concern  ? Not on file  ?Social History Narrative  ? Not on file  ? ?Social Determinants of Health  ? ?Financial Resource Strain: Not on file  ?Food Insecurity: Not on file  ?Transportation  Needs: Not on file  ?Physical Activity: Not on file  ?Stress: Not on file  ?Social Connections: Not on file  ?  ? ?Family History: ?The patient's ***family history includes Heart attack in his brother and father. ? ?ROS:   ?Please see the history of present illness.    ?*** All other systems reviewed and are negative. ? ?EKGs/Labs/Other Studies Reviewed:   ? ?The following studies were reviewed today: ?*** ? ?EKG:  EKG is *** ordered today.  The ekg ordered today demonstrates *** ? ?Recent Labs: ?03/22/2021: B Natriuretic Peptide 242.2; TSH 0.413 ?03/27/2021: ALT 82; BUN 16; Creatinine, Ser 0.91; Hemoglobin 12.4; Magnesium 2.0; Platelets 324; Potassium 4.1; Sodium 139  ?Recent Lipid Panel ?   ?Component Value Date/Time  ? CHOL 100 03/23/2021 0527  ? TRIG 57 03/23/2021 0527  ? HDL 22 (L) 03/23/2021 0527  ? CHOLHDL 4.5 03/23/2021 0527  ? VLDL 11 03/23/2021 0527  ? LDLCALC 67 03/23/2021 0527  ? ? ? ?Risk Assessment/Calculations:   ?{Does this patient have ATRIAL FIBRILLATION?[phone]} ? ?    ? ?Physical Exam:   ? ?VS:  There were no vitals taken for this visit.   ? ?Wt Readings from Last 3 Encounters:  ?03/27/21 187 lb 6.3 oz (85 kg)  ?  ? ?GEN: *** Well nourished,  well developed in no acute distress ?HEENT: Normal ?NECK: No JVD; No carotid bruits ?LYMPHATICS: No lymphadenopathy ?CARDIAC: ***RRR, no murmurs, rubs, gallops ?RESPIRATORY:  Clear to auscultation without rales, wheezing or rhonchi  ?ABDOMEN: Soft, non-tender, non-distended ?MUSCULOSKELETAL:  No edema; No deformity  ?SKIN: Warm and dry ?NEUROLOGIC:  Alert and oriented x 3 ?PSYCHIATRIC:  Normal affect  ? ?ASSESSMENT:   ? ?Atrial flutter: chads2vac=1 ?- amiodarone transition *** ?- chemical conversion xarelto for 4 weeks, can stop and transition to aspirin ?-if has recurrence of flutter will send referral to EP for ablation ? ? ?PLAN:   ? ?In order of problems listed above: ? ? ? ?   ? ?{Are you ordering a CV Procedure (e.g. stress test, cath, DCCV, TEE,  etc)?   Press F2        :[id number]}  ? ? ?Medication Adjustments/Labs and Tests Ordered: ?Current medicines are reviewed at length with the patient today.  Concerns regarding medicines are outlined above.  ?No orders of the defined types were placed in this encounter. ? ?No orders of the defined types were placed in this encounter. ? ? ?There are no Patient Instructions on file for this visit.  ? ?Signed, ?Maisie Fus, MD  ?04/23/2021 8:05 AM    ?[facility] ?

## 2021-04-27 ENCOUNTER — Encounter: Payer: Self-pay | Admitting: Internal Medicine

## 2021-06-21 ENCOUNTER — Emergency Department: Payer: Medicare Other

## 2021-06-21 ENCOUNTER — Emergency Department
Admission: EM | Admit: 2021-06-21 | Discharge: 2021-06-21 | Disposition: A | Discharge status: Home / Self Care | Payer: Medicare Other | Attending: Emergency Medicine | Admitting: Emergency Medicine

## 2021-06-21 ENCOUNTER — Other Ambulatory Visit: Payer: Self-pay

## 2021-06-21 DIAGNOSIS — M722 Plantar fascial fibromatosis: Secondary | ICD-10-CM | POA: Insufficient documentation

## 2021-06-21 DIAGNOSIS — I10 Essential (primary) hypertension: Secondary | ICD-10-CM | POA: Insufficient documentation

## 2021-06-21 DIAGNOSIS — Z87891 Personal history of nicotine dependence: Secondary | ICD-10-CM | POA: Insufficient documentation

## 2021-06-21 DIAGNOSIS — R030 Elevated blood-pressure reading, without diagnosis of hypertension: Secondary | ICD-10-CM

## 2021-06-21 DIAGNOSIS — M19071 Primary osteoarthritis, right ankle and foot: Secondary | ICD-10-CM | POA: Diagnosis not present

## 2021-06-21 DIAGNOSIS — M79671 Pain in right foot: Secondary | ICD-10-CM | POA: Diagnosis present

## 2021-06-21 DIAGNOSIS — G8929 Other chronic pain: Secondary | ICD-10-CM | POA: Diagnosis not present

## 2021-06-21 MED ORDER — ACETAMINOPHEN 325 MG PO TABS
650.0000 mg | ORAL_TABLET | Freq: Once | ORAL | Status: AC
  Administered 2021-06-21: 650 mg via ORAL
  Filled 2021-06-21: qty 2

## 2021-06-21 MED ORDER — PREDNISONE 10 MG PO TABS: ORAL_TABLET | ORAL | 0 refills | Status: DC

## 2021-06-21 NOTE — ED Triage Notes (Signed)
Pt c/o chronic issue with a callus on the ball of the right foot for years, states they some times shave it down but have never resolved the issue.

## 2021-06-21 NOTE — ED Notes (Signed)
E signature pad not working. Pt educated on discharge instructions and verbalized understanding.  

## 2021-06-21 NOTE — ED Provider Notes (Signed)
[facility] Provider Note    Event Date/Time   First MD Initiated Contact with Patient 06/21/21 1331     (approximate)   History   Foot Pain   HPI  Jack Barber is a 66 y.o. male presents to the ED with complaint of right foot pain that is been going on for approximately 10 years.  Patient denies any injury.  He states that he has been to people who "just shave off the callus".  He states that now there is pain in the bottom of his foot that shoots up his leg into his head.  Patient has history of alcohol and tobacco abuse, depression, hypertension, a flutter RVR, acute respiratory failure with hypoxia.     Physical Exam   Triage Vital Signs: ED Triage Vitals [06/21/21 1320]  Enc Vitals Group     BP (!) 139/120     Pulse Rate 85     Resp 16     Temp 97.8 F (36.6 C)     Temp Source Oral     SpO2 96 %     Weight 175 lb (79.4 kg)     Height 5\' 8"  (1.727 m)     Head Circumference      Peak Flow      Pain Score      Pain Loc      Pain Edu?      Excl. in GC?     Most recent vital signs: Vitals:   06/21/21 1320 06/21/21 1701  BP: (!) 139/120 123/67  Pulse: 85 78  Resp: 16 18  Temp: 97.8 F (36.6 C)   SpO2: 96% 95%     General: Awake, no distress.  CV:  Good peripheral perfusion.  Resp:  Normal effort.  Abd:  No distention.  Other:  On examination of the right foot there is what looks like a plantars wart however patient is markedly tender to the arch of his foot and is suspicious for plantars fasciitis.  Skin is intact.  No discoloration.  Degenerative changes digits are present.  Pulses are present.   ED Results / Procedures / Treatments   Labs (all labs ordered are listed, but only abnormal results are displayed) Labs Reviewed - No data to display    RADIOLOGY X-ray of the right foot interpreted by my self independently of the radiologist is negative for acute bony injury or calcaneal spurs.  Radiology report is  no  fracture or dislocation, arthritic changes and achilles enthesopathy     PROCEDURES:  Critical Care performed:   Procedures   MEDICATIONS ORDERED IN ED: Medications  acetaminophen (TYLENOL) tablet 650 mg (650 mg Oral Given 06/21/21 1658)     IMPRESSION / MDM / ASSESSMENT AND PLAN / ED COURSE  I reviewed the triage vital signs and the nursing notes.   Differential diagnosis includes, but is not limited to, plantars fasciitis, Achilles tendinitis, chronic foot pain.  66 year old male presents to the ED with a history of foot pain for the last 10 years.  He states that he has been to several podiatrist who usually "shave it down" which has never resolved the issue.  Patient is now living in [place] as he just recently left [place] where he was living in a "toxic environment".  We discussed findings of his x-ray and also the need for follow-up with the podiatrist.  To help assist him and walking a cam walker boot was applied.  Patient was given a  prescription for prednisone to help with inflammation which should help with pain.  He was given the name of the podiatrist on-call and also is aware that he needs to make arrangements for a PCP.  He reported that he was homeless however there is a woman that is coming to pick him up.  I reached out to our case management transition staff who states that at this time his only option for tonight is the homeless shelter.      FINAL CLINICAL IMPRESSION(S) / ED DIAGNOSES   Final diagnoses:  Plantar fasciitis of right foot  Chronic foot pain, right  Osteoarthritis of right foot, unspecified osteoarthritis type  Elevated blood pressure reading     Rx / DC Orders   ED Discharge Orders          Ordered    predniSONE (DELTASONE) 10 MG tablet        06/21/21 1634             Note:  This document was prepared using Dragon voice recognition software and may include unintentional dictation errors.   Tommi Rumps, PA-C 06/22/21  1422    Chesley Noon, MD 06/26/21 [phone]

## 2021-06-21 NOTE — Discharge Instructions (Addendum)
[facility] is on-call for podiatry today.  The office phone numbers listed on your discharge papers and you will need to call and make an appointment.  Wear the cam walker for support and protection of your right foot.  The caseworker at the hospital recommends that you stay at the homeless shelter tonight.  Transportation can be on the bus which also stops very near the homeless shelter on [address].  Call the clinics listed on the discharge papers to see if they are taking new patients so that you can get established with someone in primary care.  Your blood pressure will need to be rechecked as it was elevated while you are in the emergency department.

## 2021-06-27 ENCOUNTER — Encounter: Payer: Self-pay | Admitting: Podiatry

## 2021-06-27 ENCOUNTER — Ambulatory Visit (INDEPENDENT_AMBULATORY_CARE_PROVIDER_SITE_OTHER): Payer: Medicare Other | Admitting: Podiatry

## 2021-06-27 DIAGNOSIS — L84 Corns and callosities: Secondary | ICD-10-CM

## 2021-06-27 DIAGNOSIS — Z7901 Long term (current) use of anticoagulants: Secondary | ICD-10-CM

## 2021-06-27 DIAGNOSIS — M7741 Metatarsalgia, right foot: Secondary | ICD-10-CM | POA: Diagnosis not present

## 2021-06-27 NOTE — Progress Notes (Signed)
  Subjective:  Patient ID: Jack Barber, male    DOB: 1955/11/21,  MRN: [id number]  Pain in the bottom of the right foot  66 y.o. male presents with the above complaint. History confirmed with patient.  This is been present for some time and it continues to worsen.  A podiatrist in [place] has also removed this before.  He lives with her now because he had a heart attack, he takes Xarelto.  Objective:  Physical Exam: warm, good capillary refill, no trophic changes or ulcerative lesions, normal DP and PT pulses, normal sensory exam, venous stasis dermatitis noted, and preulcerative callus right submetatarsal 3 4 area with prominent metatarsal heads Assessment:   1. Callus of foot   2. Chronic anticoagulation   3. Metatarsalgia of right foot      Plan:  Patient was evaluated and treated and all questions answered.  All symptomatic hyperkeratoses were safely debrided with a sterile #15 blade to patient's level of comfort without incident. We discussed preventative and palliative care of these lesions including supportive and accommodative shoegear, padding, prefabricated and custom molded accommodative orthoses, use of a pumice stone and lotions/creams daily.  I discussed with him if he is able to find shoes that are more comfortable for him to wear and I may be able to adjust and offload them callus pads and dancers pads were dispensed and I showed him how to use these to take pressure off the area.  I expect this will be a recurrent issue for him.  Currently he is a poor surgical candidate.   Return if symptoms worsen or fail to improve.

## 2022-10-14 ENCOUNTER — Inpatient Hospital Stay: Payer: Medicare Other

## 2022-10-14 ENCOUNTER — Other Ambulatory Visit: Payer: Self-pay

## 2022-10-14 ENCOUNTER — Inpatient Hospital Stay (HOSPITAL_COMMUNITY)
Admit: 2022-10-14 | Discharge: 2022-10-14 | Disposition: A | Discharge status: Home / Self Care | Payer: Medicare Other | Attending: Student | Admitting: Student

## 2022-10-14 ENCOUNTER — Inpatient Hospital Stay
Admission: EM | Admit: 2022-10-14 | Discharge: 2022-10-18 | DRG: 602 | Disposition: A | Discharge status: Home / Self Care | Payer: Medicare Other | Attending: Internal Medicine | Admitting: Internal Medicine

## 2022-10-14 ENCOUNTER — Emergency Department: Payer: Medicare Other

## 2022-10-14 DIAGNOSIS — I110 Hypertensive heart disease with heart failure: Secondary | ICD-10-CM | POA: Diagnosis present

## 2022-10-14 DIAGNOSIS — Z8679 Personal history of other diseases of the circulatory system: Secondary | ICD-10-CM

## 2022-10-14 DIAGNOSIS — Z885 Allergy status to narcotic agent status: Secondary | ICD-10-CM

## 2022-10-14 DIAGNOSIS — Z87898 Personal history of other specified conditions: Secondary | ICD-10-CM | POA: Diagnosis not present

## 2022-10-14 DIAGNOSIS — I361 Nonrheumatic tricuspid (valve) insufficiency: Secondary | ICD-10-CM | POA: Diagnosis not present

## 2022-10-14 DIAGNOSIS — F1010 Alcohol abuse, uncomplicated: Secondary | ICD-10-CM | POA: Diagnosis present

## 2022-10-14 DIAGNOSIS — I483 Typical atrial flutter: Secondary | ICD-10-CM | POA: Diagnosis not present

## 2022-10-14 DIAGNOSIS — F17210 Nicotine dependence, cigarettes, uncomplicated: Secondary | ICD-10-CM | POA: Diagnosis present

## 2022-10-14 DIAGNOSIS — I4510 Unspecified right bundle-branch block: Secondary | ICD-10-CM | POA: Diagnosis present

## 2022-10-14 DIAGNOSIS — I428 Other cardiomyopathies: Secondary | ICD-10-CM | POA: Diagnosis present

## 2022-10-14 DIAGNOSIS — N179 Acute kidney failure, unspecified: Secondary | ICD-10-CM | POA: Diagnosis not present

## 2022-10-14 DIAGNOSIS — Z1152 Encounter for screening for COVID-19: Secondary | ICD-10-CM | POA: Diagnosis not present

## 2022-10-14 DIAGNOSIS — Z79899 Other long term (current) drug therapy: Secondary | ICD-10-CM

## 2022-10-14 DIAGNOSIS — T502X5A Adverse effect of carbonic-anhydrase inhibitors, benzothiadiazides and other diuretics, initial encounter: Secondary | ICD-10-CM | POA: Diagnosis not present

## 2022-10-14 DIAGNOSIS — I872 Venous insufficiency (chronic) (peripheral): Secondary | ICD-10-CM | POA: Diagnosis present

## 2022-10-14 DIAGNOSIS — Z7901 Long term (current) use of anticoagulants: Secondary | ICD-10-CM | POA: Diagnosis not present

## 2022-10-14 DIAGNOSIS — Z720 Tobacco use: Secondary | ICD-10-CM | POA: Diagnosis not present

## 2022-10-14 DIAGNOSIS — M549 Dorsalgia, unspecified: Secondary | ICD-10-CM | POA: Diagnosis present

## 2022-10-14 DIAGNOSIS — I081 Rheumatic disorders of both mitral and tricuspid valves: Secondary | ICD-10-CM | POA: Diagnosis present

## 2022-10-14 DIAGNOSIS — L03116 Cellulitis of left lower limb: Secondary | ICD-10-CM | POA: Diagnosis present

## 2022-10-14 DIAGNOSIS — J42 Unspecified chronic bronchitis: Secondary | ICD-10-CM | POA: Insufficient documentation

## 2022-10-14 DIAGNOSIS — L03119 Cellulitis of unspecified part of limb: Principal | ICD-10-CM | POA: Insufficient documentation

## 2022-10-14 DIAGNOSIS — M7989 Other specified soft tissue disorders: Secondary | ICD-10-CM | POA: Diagnosis present

## 2022-10-14 DIAGNOSIS — I340 Nonrheumatic mitral (valve) insufficiency: Secondary | ICD-10-CM | POA: Diagnosis not present

## 2022-10-14 DIAGNOSIS — R0602 Shortness of breath: Secondary | ICD-10-CM | POA: Diagnosis not present

## 2022-10-14 DIAGNOSIS — L0390 Cellulitis, unspecified: Secondary | ICD-10-CM | POA: Diagnosis present

## 2022-10-14 DIAGNOSIS — I4430 Unspecified atrioventricular block: Secondary | ICD-10-CM | POA: Diagnosis present

## 2022-10-14 DIAGNOSIS — Z8249 Family history of ischemic heart disease and other diseases of the circulatory system: Secondary | ICD-10-CM | POA: Diagnosis not present

## 2022-10-14 DIAGNOSIS — I4892 Unspecified atrial flutter: Secondary | ICD-10-CM | POA: Diagnosis present

## 2022-10-14 DIAGNOSIS — R6510 Systemic inflammatory response syndrome (SIRS) of non-infectious origin without acute organ dysfunction: Secondary | ICD-10-CM | POA: Diagnosis present

## 2022-10-14 DIAGNOSIS — Z609 Problem related to social environment, unspecified: Secondary | ICD-10-CM | POA: Diagnosis present

## 2022-10-14 DIAGNOSIS — L03115 Cellulitis of right lower limb: Principal | ICD-10-CM | POA: Diagnosis present

## 2022-10-14 DIAGNOSIS — I5023 Acute on chronic systolic (congestive) heart failure: Secondary | ICD-10-CM | POA: Diagnosis not present

## 2022-10-14 DIAGNOSIS — Z5900 Homelessness unspecified: Secondary | ICD-10-CM | POA: Diagnosis not present

## 2022-10-14 DIAGNOSIS — W57XXXA Bitten or stung by nonvenomous insect and other nonvenomous arthropods, initial encounter: Secondary | ICD-10-CM | POA: Diagnosis present

## 2022-10-14 DIAGNOSIS — J4489 Other specified chronic obstructive pulmonary disease: Secondary | ICD-10-CM | POA: Diagnosis present

## 2022-10-14 DIAGNOSIS — F32A Depression, unspecified: Secondary | ICD-10-CM | POA: Diagnosis present

## 2022-10-14 DIAGNOSIS — I5021 Acute systolic (congestive) heart failure: Secondary | ICD-10-CM | POA: Diagnosis not present

## 2022-10-14 DIAGNOSIS — R9431 Abnormal electrocardiogram [ECG] [EKG]: Secondary | ICD-10-CM | POA: Diagnosis not present

## 2022-10-14 DIAGNOSIS — Z91199 Patient's noncompliance with other medical treatment and regimen due to unspecified reason: Secondary | ICD-10-CM

## 2022-10-14 DIAGNOSIS — A419 Sepsis, unspecified organism: Secondary | ICD-10-CM | POA: Diagnosis not present

## 2022-10-14 DIAGNOSIS — I4891 Unspecified atrial fibrillation: Secondary | ICD-10-CM | POA: Diagnosis present

## 2022-10-14 LAB — HEPATIC FUNCTION PANEL
ALT: 27 U/L (ref 0–44)
AST: 26 U/L (ref 15–41)
Albumin: 4.2 g/dL (ref 3.5–5.0)
Alkaline Phosphatase: 48 U/L (ref 38–126)
Bilirubin, Direct: 0.2 mg/dL (ref 0.0–0.2)
Indirect Bilirubin: 0.9 mg/dL (ref 0.3–0.9)
Total Bilirubin: 1.1 mg/dL (ref 0.3–1.2)
Total Protein: 6.3 g/dL — ABNORMAL LOW (ref 6.5–8.1)

## 2022-10-14 LAB — ECHOCARDIOGRAM COMPLETE
AR max vel: 1.82 cm2
AV Area VTI: 1.82 cm2
AV Area mean vel: 1.51 cm2
AV Mean grad: 5.3 mmHg
AV Peak grad: 8.6 mmHg
Ao pk vel: 1.46 m/s
Area-P 1/2: 3.81 cm2
Height: 68 in
MV VTI: 2.6 cm2
S' Lateral: 3.5 cm
Weight: 2786.61 [oz_av]

## 2022-10-14 LAB — CBC WITH DIFFERENTIAL/PLATELET
Abs Immature Granulocytes: 0.07 10*3/uL (ref 0.00–0.07)
Basophils Absolute: 0.1 10*3/uL (ref 0.0–0.1)
Basophils Relative: 1 %
Eosinophils Absolute: 0.4 10*3/uL (ref 0.0–0.5)
Eosinophils Relative: 2 %
HCT: 44.9 % (ref 39.0–52.0)
Hemoglobin: 15 g/dL (ref 13.0–17.0)
Immature Granulocytes: 1 %
Lymphocytes Relative: 14 %
Lymphs Abs: 2.1 10*3/uL (ref 0.7–4.0)
MCH: 32.7 pg (ref 26.0–34.0)
MCHC: 33.4 g/dL (ref 30.0–36.0)
MCV: 97.8 fL (ref 80.0–100.0)
Monocytes Absolute: 1.2 10*3/uL — ABNORMAL HIGH (ref 0.1–1.0)
Monocytes Relative: 8 %
Neutro Abs: 11.6 10*3/uL — ABNORMAL HIGH (ref 1.7–7.7)
Neutrophils Relative %: 74 %
Platelets: 245 10*3/uL (ref 150–400)
RBC: 4.59 MIL/uL (ref 4.22–5.81)
RDW: 13.7 % (ref 11.5–15.5)
WBC: 15.3 10*3/uL — ABNORMAL HIGH (ref 4.0–10.5)
nRBC: 0 % (ref 0.0–0.2)

## 2022-10-14 LAB — BASIC METABOLIC PANEL
Anion gap: 6 (ref 5–15)
BUN: 23 mg/dL (ref 8–23)
CO2: 27 mmol/L (ref 22–32)
Calcium: 9.7 mg/dL (ref 8.9–10.3)
Chloride: 106 mmol/L (ref 98–111)
Creatinine, Ser: 0.99 mg/dL (ref 0.61–1.24)
GFR, Estimated: >60 mL/min (ref >60)
Glucose, Bld: 105 mg/dL — ABNORMAL HIGH (ref 70–99)
Potassium: 4.5 mmol/L (ref 3.5–5.1)
Sodium: 139 mmol/L (ref 135–145)

## 2022-10-14 LAB — CBC
HCT: 45.4 % (ref 39.0–52.0)
Hemoglobin: 15.2 g/dL (ref 13.0–17.0)
MCH: 32.1 pg (ref 26.0–34.0)
MCHC: 33.5 g/dL (ref 30.0–36.0)
MCV: 96 fL (ref 80.0–100.0)
Platelets: 263 10*3/uL (ref 150–400)
RBC: 4.73 MIL/uL (ref 4.22–5.81)
RDW: 13.6 % (ref 11.5–15.5)
WBC: 16.7 10*3/uL — ABNORMAL HIGH (ref 4.0–10.5)
nRBC: 0 % (ref 0.0–0.2)

## 2022-10-14 LAB — TROPONIN I (HIGH SENSITIVITY)
Troponin I (High Sensitivity): 17 ng/L (ref <18)
Troponin I (High Sensitivity): 14 ng/L (ref <18)

## 2022-10-14 LAB — URINE DRUG SCREEN, QUALITATIVE (ARMC ONLY)
Amphetamines, Ur Screen: NOT DETECTED
Barbiturates, Ur Screen: NOT DETECTED
Benzodiazepine, Ur Scrn: NOT DETECTED
Cannabinoid 50 Ng, Ur ~~LOC~~: NOT DETECTED
Cocaine Metabolite,Ur ~~LOC~~: NOT DETECTED
MDMA (Ecstasy)Ur Screen: NOT DETECTED
Methadone Scn, Ur: NOT DETECTED
Opiate, Ur Screen: NOT DETECTED
Phencyclidine (PCP) Ur S: NOT DETECTED
Tricyclic, Ur Screen: NOT DETECTED

## 2022-10-14 LAB — URINALYSIS, W/ REFLEX TO CULTURE (INFECTION SUSPECTED)
Bacteria, UA: NONE SEEN
Bilirubin Urine: NEGATIVE
Glucose, UA: NEGATIVE mg/dL
Hgb urine dipstick: NEGATIVE
Ketones, ur: NEGATIVE mg/dL
Leukocytes,Ua: NEGATIVE
Nitrite: NEGATIVE
Protein, ur: NEGATIVE mg/dL
Specific Gravity, Urine: 1.025 (ref 1.005–1.030)
Squamous Epithelial / HPF: 0 /HPF (ref 0–5)
pH: 6 (ref 5.0–8.0)

## 2022-10-14 LAB — CORTISOL-AM, BLOOD: Cortisol - AM: 7.5 ug/dL (ref 6.7–22.6)

## 2022-10-14 LAB — PROTIME-INR
INR: 1.1 (ref 0.8–1.2)
INR: 1.2 (ref 0.8–1.2)
Prothrombin Time: 14.4 s (ref 11.4–15.2)
Prothrombin Time: 15.4 s — ABNORMAL HIGH (ref 11.4–15.2)

## 2022-10-14 LAB — ETHANOL: Alcohol, Ethyl (B): <10 mg/dL (ref <10)

## 2022-10-14 LAB — TSH: TSH: 0.868 u[IU]/mL (ref 0.350–4.500)

## 2022-10-14 LAB — RESP PANEL BY RT-PCR (RSV, FLU A&B, COVID)  RVPGX2
Influenza A by PCR: NEGATIVE
Influenza B by PCR: NEGATIVE
Resp Syncytial Virus by PCR: NEGATIVE
SARS Coronavirus 2 by RT PCR: NEGATIVE

## 2022-10-14 LAB — D-DIMER, QUANTITATIVE: D-Dimer, Quant: <0.27 ug{FEU}/mL (ref 0.00–0.50)

## 2022-10-14 LAB — HIV ANTIBODY (ROUTINE TESTING W REFLEX): HIV Screen 4th Generation wRfx: NONREACTIVE

## 2022-10-14 LAB — PROCALCITONIN
Procalcitonin: <0.1 ng/mL
Procalcitonin: <0.1 ng/mL

## 2022-10-14 LAB — LACTIC ACID, PLASMA
Lactic Acid, Venous: 1.1 mmol/L (ref 0.5–1.9)
Lactic Acid, Venous: 0.9 mmol/L (ref 0.5–1.9)

## 2022-10-14 LAB — APTT: aPTT: 28 s (ref 24–36)

## 2022-10-14 MED ORDER — VANCOMYCIN HCL 1750 MG/350ML IV SOLN
1750.0000 mg | Freq: Once | INTRAVENOUS | Status: AC
  Administered 2022-10-14: 1750 mg via INTRAVENOUS
  Filled 2022-10-14: qty 350

## 2022-10-14 MED ORDER — THIAMINE HCL 100 MG/ML IJ SOLN
100.0000 mg | Freq: Once | INTRAMUSCULAR | Status: AC
  Administered 2022-10-14: 100 mg via INTRAVENOUS
  Filled 2022-10-14: qty 2

## 2022-10-14 MED ORDER — LACTATED RINGERS IV SOLN
150.0000 mL/h | INTRAVENOUS | Status: DC
  Administered 2022-10-14: 150 mL/h via INTRAVENOUS

## 2022-10-14 MED ORDER — FUROSEMIDE 10 MG/ML IJ SOLN
40.0000 mg | Freq: Two times a day (BID) | INTRAMUSCULAR | Status: DC
  Administered 2022-10-14 – 2022-10-18 (×8): 40 mg via INTRAVENOUS
  Filled 2022-10-14 (×8): qty 4

## 2022-10-14 MED ORDER — SODIUM CHLORIDE 0.9 % IV SOLN: INTRAVENOUS | Status: DC

## 2022-10-14 MED ORDER — AMIODARONE HCL IN DEXTROSE 360-4.14 MG/200ML-% IV SOLN
30.0000 mg/h | INTRAVENOUS | Status: DC
  Administered 2022-10-14 – 2022-10-17 (×6): 30 mg/h via INTRAVENOUS
  Filled 2022-10-14 (×6): qty 200

## 2022-10-14 MED ORDER — VANCOMYCIN HCL IN DEXTROSE 1-5 GM/200ML-% IV SOLN: 1000.0000 mg | Freq: Once | INTRAVENOUS | Status: DC

## 2022-10-14 MED ORDER — FLUTICASONE FUROATE-VILANTEROL 200-25 MCG/ACT IN AEPB
1.0000 | INHALATION_SPRAY | Freq: Every day | RESPIRATORY_TRACT | Status: DC
  Administered 2022-10-15 – 2022-10-18 (×4): 1 via RESPIRATORY_TRACT
  Filled 2022-10-14 (×2): qty 28

## 2022-10-14 MED ORDER — SODIUM CHLORIDE 0.9 % IV SOLN
1.0000 g | INTRAVENOUS | Status: DC
  Administered 2022-10-15 – 2022-10-16 (×2): 1 g via INTRAVENOUS
  Filled 2022-10-14 (×2): qty 10

## 2022-10-14 MED ORDER — LACTATED RINGERS IV SOLN: INTRAVENOUS | Status: DC

## 2022-10-14 MED ORDER — IOHEXOL 350 MG/ML SOLN
80.0000 mL | Freq: Once | INTRAVENOUS | Status: AC | PRN
  Administered 2022-10-14: 80 mL via INTRAVENOUS

## 2022-10-14 MED ORDER — AMIODARONE LOAD VIA INFUSION
150.0000 mg | Freq: Once | INTRAVENOUS | Status: AC
  Administered 2022-10-14: 150 mg via INTRAVENOUS
  Filled 2022-10-14: qty 83.34

## 2022-10-14 MED ORDER — MIDODRINE HCL 5 MG PO TABS
10.0000 mg | ORAL_TABLET | Freq: Three times a day (TID) | ORAL | Status: DC
  Administered 2022-10-14: 10 mg via ORAL
  Filled 2022-10-14: qty 2

## 2022-10-14 MED ORDER — VANCOMYCIN HCL 1750 MG/350ML IV SOLN
1750.0000 mg | INTRAVENOUS | Status: DC
  Administered 2022-10-15: 1750 mg via INTRAVENOUS
  Filled 2022-10-14: qty 350

## 2022-10-14 MED ORDER — METOPROLOL TARTRATE 25 MG PO TABS
12.5000 mg | ORAL_TABLET | Freq: Two times a day (BID) | ORAL | Status: DC
  Administered 2022-10-14 – 2022-10-17 (×5): 12.5 mg via ORAL
  Filled 2022-10-14 (×9): qty 1

## 2022-10-14 MED ORDER — ALBUTEROL SULFATE (2.5 MG/3ML) 0.083% IN NEBU
2.5000 mg | INHALATION_SOLUTION | RESPIRATORY_TRACT | Status: DC | PRN
  Administered 2022-10-15: 2.5 mg via RESPIRATORY_TRACT
  Filled 2022-10-14: qty 3

## 2022-10-14 MED ORDER — HYDROCODONE-ACETAMINOPHEN 5-325 MG PO TABS: 1.0000 | ORAL_TABLET | ORAL | Status: DC | PRN

## 2022-10-14 MED ORDER — SODIUM CHLORIDE 0.9 % IV BOLUS
500.0000 mL | Freq: Once | INTRAVENOUS | Status: AC
  Administered 2022-10-14: 500 mL via INTRAVENOUS

## 2022-10-14 MED ORDER — POTASSIUM CHLORIDE CRYS ER 20 MEQ PO TBCR
20.0000 meq | EXTENDED_RELEASE_TABLET | Freq: Two times a day (BID) | ORAL | Status: DC
  Administered 2022-10-14 – 2022-10-18 (×8): 20 meq via ORAL
  Filled 2022-10-14 (×8): qty 1

## 2022-10-14 MED ORDER — KETOROLAC TROMETHAMINE 15 MG/ML IJ SOLN: 15.0000 mg | Freq: Four times a day (QID) | INTRAMUSCULAR | Status: DC | PRN

## 2022-10-14 MED ORDER — ONDANSETRON HCL 4 MG PO TABS: 4.0000 mg | ORAL_TABLET | Freq: Four times a day (QID) | ORAL | Status: DC | PRN

## 2022-10-14 MED ORDER — HEPARIN BOLUS VIA INFUSION
2400.0000 [IU] | Freq: Once | INTRAVENOUS | Status: AC
  Administered 2022-10-14: 2400 [IU] via INTRAVENOUS
  Filled 2022-10-14: qty 2400

## 2022-10-14 MED ORDER — ACETAMINOPHEN 650 MG RE SUPP: 650.0000 mg | Freq: Four times a day (QID) | RECTAL | Status: DC | PRN

## 2022-10-14 MED ORDER — LACTATED RINGERS IV BOLUS (SEPSIS)
1000.0000 mL | Freq: Once | INTRAVENOUS | Status: AC
  Administered 2022-10-14: 1000 mL via INTRAVENOUS

## 2022-10-14 MED ORDER — HEPARIN (PORCINE) 25000 UT/250ML-% IV SOLN
1600.0000 [IU]/h | INTRAVENOUS | Status: DC
  Administered 2022-10-14 (×2): 1150 [IU]/h via INTRAVENOUS
  Administered 2022-10-15 – 2022-10-17 (×3): 1450 [IU]/h via INTRAVENOUS
  Filled 2022-10-14 (×4): qty 250

## 2022-10-14 MED ORDER — SODIUM CHLORIDE 0.9 % IV SOLN
2.0000 g | INTRAVENOUS | Status: DC
  Administered 2022-10-14: 2 g via INTRAVENOUS
  Filled 2022-10-14: qty 20

## 2022-10-14 MED ORDER — ACETAMINOPHEN 500 MG PO TABS
1000.0000 mg | ORAL_TABLET | Freq: Once | ORAL | Status: AC
  Administered 2022-10-14: 1000 mg via ORAL
  Filled 2022-10-14: qty 2

## 2022-10-14 MED ORDER — NICOTINE 21 MG/24HR TD PT24
21.0000 mg | MEDICATED_PATCH | Freq: Every day | TRANSDERMAL | Status: DC
  Administered 2022-10-14 – 2022-10-18 (×5): 21 mg via TRANSDERMAL
  Filled 2022-10-14 (×5): qty 1

## 2022-10-14 MED ORDER — AMIODARONE HCL IN DEXTROSE 360-4.14 MG/200ML-% IV SOLN
60.0000 mg/h | INTRAVENOUS | Status: AC
  Administered 2022-10-14 (×2): 60 mg/h via INTRAVENOUS
  Filled 2022-10-14: qty 200

## 2022-10-14 MED ORDER — ENOXAPARIN SODIUM 40 MG/0.4ML IJ SOSY
40.0000 mg | PREFILLED_SYRINGE | INTRAMUSCULAR | Status: DC
  Administered 2022-10-14: 40 mg via SUBCUTANEOUS
  Filled 2022-10-14: qty 0.4

## 2022-10-14 MED ORDER — ONDANSETRON HCL 4 MG/2ML IJ SOLN: 4.0000 mg | Freq: Four times a day (QID) | INTRAMUSCULAR | Status: DC | PRN

## 2022-10-14 MED ORDER — ACETAMINOPHEN 325 MG PO TABS: 650.0000 mg | ORAL_TABLET | Freq: Four times a day (QID) | ORAL | Status: DC | PRN

## 2022-10-14 MED ORDER — LACTATED RINGERS IV BOLUS (SEPSIS)
500.0000 mL | Freq: Once | INTRAVENOUS | Status: AC
  Administered 2022-10-14: 500 mL via INTRAVENOUS

## 2022-10-14 NOTE — Plan of Care (Signed)
Patient was seen and examined at bedside, patient was complaining of significant pain in the bilateral lower extremities.  Denied any chest pain or palpitation, no shortness of breath.  # Lower extremity cellulitis: continue current antibiotics. Follow venous duplex of lower extremities to rule out DVT # A-fib with RVR, patient has history of atrial flutter and was on amiodarone and Xarelto last year for short period of time.  Currently patient is not on any medication.  Cardiology consulted, patient was started on amiodarone.  Follow cardiology for DOAC? Follow EKG and 2D echocardiogram order placed Check urine drug screen # Right foot plantar callus, causing pain and difficulty walking, discussed with podiatrist Dr. Lilian Kapur, recommended postop shoe and follow-up with an outpatient, routine care and pain control.  No intervention during hospital stay. # COPD, resumed Breo Ellipta inhaler.  Continue albuterol as needed. We will continue to monitor and patient needs to stay few days in the hospital.

## 2022-10-14 NOTE — ED Provider Notes (Signed)
[facility] Provider Note    Event Date/Time   First MD Initiated Contact with Patient 10/14/22 0226     (approximate)   History   Insect Bite (Ants - feet)   HPI  Jack Barber is a 67 y.o. male with history of alcohol abuse, homelessness who presents to the emergency department with bilateral lower extremity swelling and pain.  States he is not sure if he was bitten by insects but his legs are hot, red and painful.  No known fevers but has had chills.  No chest pain, shortness of breath, cough, vomiting or diarrhea.   History provided by patient and EMS.    Past Medical History:  Diagnosis Date   Alcohol abuse    Depression    Tobacco abuse     History reviewed. No pertinent surgical history.  MEDICATIONS:  Prior to Admission medications   Medication Sig Start Date End Date Taking? Authorizing Provider  acetaminophen (TYLENOL) 325 MG tablet Take 2 tablets (650 mg total) by mouth every 4 (four) hours as needed for headache or mild pain. 03/27/21   Marguerita Merles Latif, DO  amiodarone (PACERONE) 200 MG tablet Take 1 tablet (200 mg total) by mouth 2 (two) times daily. 03/27/21   Azalee Course, PA  fluticasone furoate-vilanterol (BREO ELLIPTA) 200-25 MCG/ACT AEPB Inhale 1 puff into the lungs daily. 03/28/21   Sheikh, Omair Latif, DO  nicotine (NICODERM CQ - DOSED IN MG/24 HOURS) 21 mg/24hr patch Place 1 patch (21 mg total) onto the skin daily. 03/28/21   Marguerita Merles Latif, DO  predniSONE (DELTASONE) 10 MG tablet Take 3 tablets once a day for 5 days 06/21/21   Tommi Rumps, PA-C  rivaroxaban (XARELTO) 20 MG TABS tablet Take 1 tablet (20 mg total) by mouth daily with supper. 03/26/21   Azalee Course, PA    Physical Exam   Triage Vital Signs: ED Triage Vitals  Encounter Vitals Group     BP 10/14/22 0208 120/73     Systolic BP Percentile --      Diastolic BP Percentile --      Pulse Rate 10/14/22 0208 (!) 142     Resp 10/14/22 0208 20     Temp  10/14/22 0208 98 F (36.7 C)     Temp Source 10/14/22 0208 Oral     SpO2 10/14/22 0208 97 %     Weight 10/14/22 0207 174 lb 2.6 oz (79 kg)     Height 10/14/22 0207 5\' 8"  (1.727 m)     Head Circumference --      Peak Flow --      Pain Score 10/14/22 0207 0     Pain Loc --      Pain Education --      Exclude from Growth Chart --     Most recent vital signs: Vitals:   10/14/22 0252 10/14/22 0400  BP:  109/77  Pulse:  (!) 59  Resp:  (!) 22  Temp: 100.2 F (37.9 C)   SpO2:  98%    CONSTITUTIONAL: Alert, responds appropriately to questions.  Disheveled HEAD: Normocephalic, atraumatic EYES: Conjunctivae clear, pupils appear equal, sclera nonicteric ENT: normal nose; moist mucous membranes NECK: Supple, normal ROM CARD: Regular and tachycardic; S1 and S2 appreciated RESP: Normal chest excursion without splinting or tachypnea; breath sounds clear and equal bilaterally; no wheezes, no rhonchi, no rales, no hypoxia or respiratory distress, speaking full sentences ABD/GI: Non-distended; soft, non-tender, no rebound, no guarding, no  peritoneal signs BACK: The back appears normal EXT: Normal ROM in all joints; no deformity noted, trace edema in bilateral distal lower extremities, both legs are warm and hot to touch with associated redness over the distal shin and calf worse on the left than the right.  Compartments in the legs are soft.  He has dopplerable 2+ DP pulses bilaterally. NEURO: Moves all extremities equally, normal speech PSYCH: The patient's mood and manner are appropriate.   ED Results / Procedures / Treatments   LABS: (all labs ordered are listed, but only abnormal results are displayed) Labs Reviewed  BASIC METABOLIC PANEL - Abnormal; Notable for the following components:      Result Value   Glucose, Bld 105 (*)    All other components within normal limits  CBC - Abnormal; Notable for the following components:   WBC 16.7 (*)    All other components within normal  limits  CBC WITH DIFFERENTIAL/PLATELET - Abnormal; Notable for the following components:   WBC 15.3 (*)    Neutro Abs 11.6 (*)    Monocytes Absolute 1.2 (*)    All other components within normal limits  HEPATIC FUNCTION PANEL - Abnormal; Notable for the following components:   Total Protein 6.3 (*)    All other components within normal limits  RESP PANEL BY RT-PCR (RSV, FLU A&B, COVID)  RVPGX2  CULTURE, BLOOD (ROUTINE X 2)  CULTURE, BLOOD (ROUTINE X 2)  LACTIC ACID, PLASMA  PROTIME-INR  APTT  PROCALCITONIN  ETHANOL  LACTIC ACID, PLASMA  URINALYSIS, W/ REFLEX TO CULTURE (INFECTION SUSPECTED)  URINE DRUG SCREEN, QUALITATIVE ([facility] ONLY)  TROPONIN I (HIGH SENSITIVITY)  TROPONIN I (HIGH SENSITIVITY)     EKG:    Date: 10/14/2022  Rate: 141  Rhythm: normal sinus rhythm  QRS Axis: normal  Intervals: normal  ST/T Wave abnormalities: normal  Conduction Disutrbances: Bifascicular block  Narrative Interpretation: Sinus tachycardia, right bundle branch block, left posterior fascicular block       RADIOLOGY: My personal review and interpretation of imaging: Chest x-ray clear  I have personally reviewed all radiology reports.   DG Chest Port 1 View  Result Date: 10/14/2022 CLINICAL DATA:  Tachycardia EXAM: PORTABLE CHEST 1 VIEW COMPARISON:  03/25/2021 FINDINGS: Cardiac shadows within normal limits. Lungs are well aerated bilaterally. No focal infiltrate or effusion is seen. No bony abnormality is noted. IMPRESSION: No active disease. Electronically Signed   By: Alcide Clever M.D.   On: 10/14/2022 03:03     PROCEDURES:  Critical Care performed: Yes, see critical care procedure note(s)   CRITICAL CARE Performed by: Baxter Hire Dezmin Kittelson   Total critical care time: 40 minutes  Critical care time was exclusive of separately billable procedures and treating other patients.  Critical care was necessary to treat or prevent imminent or life-threatening deterioration.  Critical care  was time spent personally by me on the following activities: development of treatment plan with patient and/or surrogate as well as nursing, discussions with consultants, evaluation of patient's response to treatment, examination of patient, obtaining history from patient or surrogate, ordering and performing treatments and interventions, ordering and review of laboratory studies, ordering and review of radiographic studies, pulse oximetry and re-evaluation of patient's condition.   Marland Kitchen1-3 Lead EKG Interpretation  Performed by: Rian Koon, Layla Maw, DO Authorized by: Shanterica Biehler, Layla Maw, DO     Interpretation: abnormal     ECG rate:  142   ECG rate assessment: tachycardic     Rhythm: sinus tachycardia  Ectopy: none     Conduction: normal       IMPRESSION / MDM / ASSESSMENT AND PLAN / ED COURSE  I reviewed the triage vital signs and the nursing notes.    Patient here with sepsis from cellulitis.  The patient is on the cardiac monitor to evaluate for evidence of arrhythmia and/or significant heart rate changes.   DIFFERENTIAL DIAGNOSIS (includes but not limited to):   Cellulitis, sepsis, bacteremia, substance abuse, alcohol withdrawal, electrolyte derangement, anemia   Patient's presentation is most consistent with acute presentation with potential threat to life or bodily function.   PLAN: Will obtain labs, urine, chest x-ray, cultures.  Will give antibiotics, IV fluids, antipyretics.   MEDICATIONS GIVEN IN ED: Medications  lactated ringers infusion (has no administration in time range)  lactated ringers bolus 1,000 mL (0 mLs Intravenous Stopped 10/14/22 0402)    And  lactated ringers bolus 1,000 mL (1,000 mLs Intravenous New Bag/Given 10/14/22 0401)    And  lactated ringers bolus 500 mL (has no administration in time range)  cefTRIAXone (ROCEPHIN) 2 g in sodium chloride 0.9 % 100 mL IVPB (0 g Intravenous Stopped 10/14/22 0402)  vancomycin (VANCOREADY) IVPB 1750 mg/350 mL (1,750 mg  Intravenous New Bag/Given 10/14/22 0402)  thiamine (VITAMIN B1) injection 100 mg (100 mg Intravenous Given 10/14/22 0257)  acetaminophen (TYLENOL) tablet 1,000 mg (1,000 mg Oral Given 10/14/22 0304)     ED COURSE: Patient has a leukocytosis of 16,000.  Lactic is normal.  COVID and flu negative.  Chest x-ray reviewed and interpreted by myself and the radiologist and is clear.  Will discuss with hospitalist for admission for sepsis from cellulitis.  Patient continues to have reassuring blood pressure.  Heart rate is improving.  Sepsis - Repeat Assessment  Performed at:    4:16AM  Vitals     Blood pressure 109/77, pulse (!) 59, temperature 100.2 F (37.9 C), temperature source Rectal, resp. rate (!) 22, height 5\' 8"  (1.727 m), weight 79 kg, SpO2 98%.  Heart:     Tachycardic  Lungs:    CTA  Capillary Refill:   <2 sec  Peripheral Pulse:   Radial pulse palpable  Skin:     Normal Color      CONSULTS:  Consulted and discussed patient's case with hospitalist, Dr. Para March.  I have recommended admission and consulting physician agrees and will place admission orders.  Patient (and family if present) agree with this plan.   I reviewed all nursing notes, vitals, pertinent previous records.  All labs, EKGs, imaging ordered have been independently reviewed and interpreted by myself.    OUTSIDE RECORDS REVIEWED: Reviewed last internal medicine note on 06/27/2021.       FINAL CLINICAL IMPRESSION(S) / ED DIAGNOSES   Final diagnoses:  Cellulitis of lower extremity, unspecified laterality  Acute sepsis (HCC)     Rx / DC Orders   ED Discharge Orders     None        Note:  This document was prepared using Dragon voice recognition software and may include unintentional dictation errors.   Merna Baldi, Layla Maw, DO 10/14/22 [phone]

## 2022-10-14 NOTE — H&P (Signed)
History and Physical    Patient: Jack Barber HYQ:[id number] DOB: Nov 16, 1955 DOA: 10/14/2022 DOS: the patient was seen and examined on 10/14/2022 PCP: Mick Sell, MD  Patient coming from: Homeless  Chief Complaint:  Chief Complaint  Patient presents with   Insect Bite    Ants - feet    HPI: Jack Barber is a 67 y.o. male with medical history significant for Homelessness, depression, alcohol use disorder, chronic pain, HTN, A-flutter previously on amiodarone and Xarelto, COPD, who presents to the ED with pain swelling and redness bilateral lower extremities.  He denies fever or chills has no shortness of breath.  He states his legs have always been swelling but over the last few days he was on his feet a lot and they became very painful.  ED course and data review: Initially tachycardic to 142 with rectal temp 100.2 otherwise normal vitals. Labs notable for WBC 15,000 with lactic acid 1.1 and procalcitonin less than 0.1 other labs unremarkable. EKG, personally viewed and interpreted showing sinus tachycardia at 143 Chest x-ray no active disease Patient started on ceftriaxone and vancomycin and given a fluid bolus and Tylenol for pain Hospitalist consulted for admission.   Review of Systems: As mentioned in the history of present illness. All other systems reviewed and are negative.  Past Medical History:  Diagnosis Date   Alcohol abuse    Depression    Tobacco abuse    History reviewed. No pertinent surgical history. Social History:  reports that he has been smoking cigarettes. He does not have any smokeless tobacco history on file. He reports that he does not currently use alcohol after a past usage of about 1.0 standard drink of alcohol per week. He reports that he does not currently use drugs.  Allergies  Allergen Reactions   Codeine Itching    Family History  Problem Relation Age of Onset   Heart attack Father    Heart attack Brother     Prior to  Admission medications   Medication Sig Start Date End Date Taking? Authorizing Provider  acetaminophen (TYLENOL) 325 MG tablet Take 2 tablets (650 mg total) by mouth every 4 (four) hours as needed for headache or mild pain. 03/27/21   Marguerita Merles Latif, DO  amiodarone (PACERONE) 200 MG tablet Take 1 tablet (200 mg total) by mouth 2 (two) times daily. 03/27/21   Azalee Course, PA  fluticasone furoate-vilanterol (BREO ELLIPTA) 200-25 MCG/ACT AEPB Inhale 1 puff into the lungs daily. 03/28/21   Sheikh, Omair Latif, DO  nicotine (NICODERM CQ - DOSED IN MG/24 HOURS) 21 mg/24hr patch Place 1 patch (21 mg total) onto the skin daily. 03/28/21   Marguerita Merles Latif, DO  predniSONE (DELTASONE) 10 MG tablet Take 3 tablets once a day for 5 days 06/21/21   Tommi Rumps, PA-C  rivaroxaban (XARELTO) 20 MG TABS tablet Take 1 tablet (20 mg total) by mouth daily with supper. 03/26/21   Azalee Course, PA    Physical Exam: Vitals:   10/14/22 0207 10/14/22 0208 10/14/22 0252 10/14/22 0400  BP:  120/73  109/77  Pulse:  (!) 142  (!) 59  Resp:  20  (!) 22  Temp:  98 F (36.7 C) 100.2 F (37.9 C)   TempSrc:  Oral Rectal   SpO2:  97%  98%  Weight: 79 kg     Height: 5\' 8"  (1.727 m)      Physical Exam Vitals and nursing note reviewed.  Constitutional:  General: He is not in acute distress. HENT:     Head: Normocephalic and atraumatic.  Cardiovascular:     Rate and Rhythm: Normal rate and regular rhythm.     Heart sounds: Normal heart sounds.  Pulmonary:     Effort: Pulmonary effort is normal.     Breath sounds: Normal breath sounds.  Abdominal:     Palpations: Abdomen is soft.     Tenderness: There is no abdominal tenderness.  Musculoskeletal:     Comments: Redness erythema and warmth.  See pic below  Neurological:     Mental Status: Mental status is at baseline.     Labs on Admission: I have personally reviewed following labs and imaging studies  CBC: Recent Labs  Lab 10/14/22 0227 10/14/22 0243   WBC 16.7* 15.3*  NEUTROABS  --  11.6*  HGB 15.2 15.0  HCT 45.4 44.9  MCV 96.0 97.8  PLT 263 245   Basic Metabolic Panel: Recent Labs  Lab 10/14/22 0227  NA 139  K 4.5  CL 106  CO2 27  GLUCOSE 105*  BUN 23  CREATININE 0.99  CALCIUM 9.7   GFR: Estimated Creatinine Clearance: 70.1 mL/min (by C-G formula based on SCr of 0.99 mg/dL). Liver Function Tests: Recent Labs  Lab 10/14/22 0243  AST 26  ALT 27  ALKPHOS 48  BILITOT 1.1  PROT 6.3*  ALBUMIN 4.2   No results for input(s): "LIPASE", "AMYLASE" in the last 168 hours. No results for input(s): "AMMONIA" in the last 168 hours. Coagulation Profile: Recent Labs  Lab 10/14/22 0243  INR 1.1   Cardiac Enzymes: No results for input(s): "CKTOTAL", "CKMB", "CKMBINDEX", "TROPONINI" in the last 168 hours. BNP (last 3 results) No results for input(s): "PROBNP" in the last 8760 hours. HbA1C: No results for input(s): "HGBA1C" in the last 72 hours. CBG: No results for input(s): "GLUCAP" in the last 168 hours. Lipid Profile: No results for input(s): "CHOL", "HDL", "LDLCALC", "TRIG", "CHOLHDL", "LDLDIRECT" in the last 72 hours. Thyroid Function Tests: No results for input(s): "TSH", "T4TOTAL", "FREET4", "T3FREE", "THYROIDAB" in the last 72 hours. Anemia Panel: No results for input(s): "VITAMINB12", "FOLATE", "FERRITIN", "TIBC", "IRON", "RETICCTPCT" in the last 72 hours. Urine analysis:    Component Value Date/Time   COLORURINE YELLOW 03/22/2021 2032   APPEARANCEUR CLEAR 03/22/2021 2032   LABSPEC 1.010 03/22/2021 2032   PHURINE 5.0 03/22/2021 2032   GLUCOSEU NEGATIVE 03/22/2021 2032   HGBUR NEGATIVE 03/22/2021 2032   BILIRUBINUR NEGATIVE 03/22/2021 2032   KETONESUR 5 (A) 03/22/2021 2032   PROTEINUR NEGATIVE 03/22/2021 2032   NITRITE NEGATIVE 03/22/2021 2032   LEUKOCYTESUR NEGATIVE 03/22/2021 2032    Radiological Exams on Admission: DG Chest Port 1 View  Result Date: 10/14/2022 CLINICAL DATA:  Tachycardia EXAM:  PORTABLE CHEST 1 VIEW COMPARISON:  03/25/2021 FINDINGS: Cardiac shadows within normal limits. Lungs are well aerated bilaterally. No focal infiltrate or effusion is seen. No bony abnormality is noted. IMPRESSION: No active disease. Electronically Signed   By: Alcide Clever M.D.   On: 10/14/2022 03:03     Data Reviewed: Relevant notes from primary care and specialist visits, past discharge summaries as available in EHR, including Care Everywhere. Prior diagnostic testing as pertinent to current admission diagnoses Updated medications and problem lists for reconciliation ED course, including vitals, labs, imaging, treatment and response to treatment Triage notes, nursing and pharmacy notes and ED provider's notes Notable results as noted in HPI   Assessment and Plan: * Bilateral lower leg cellulitis Sepsis  Patient was tachycardic and had a rectal temp of 100.2, WBC 15,000 but normal lactic acid and procalcitonin Sepsis fluids Continue Rocephin and vancomycin Keep legs elevated  Chronic bronchitis (HCC) Not acutely exacerbated DuoNebs as needed  History of alcohol use disorder States he no longer drinks  History of atrial flutter Patient was previously on amiodarone and Xarelto but no longer follows up and has not take the medication in a long time Not currently in A-fib flutter     DVT prophylaxis: Lovenox  Consults: none  Advance Care Planning:   Code Status: Prior   Family Communication: none  Disposition Plan: Back to previous home environment  Severity of Illness: The appropriate patient status for this patient is INPATIENT. Inpatient status is judged to be reasonable and necessary in order to provide the required intensity of service to ensure the patient's safety. The patient's presenting symptoms, physical exam findings, and initial radiographic and laboratory data in the context of their chronic comorbidities is felt to place them at high risk for further clinical  deterioration. Furthermore, it is not anticipated that the patient will be medically stable for discharge from the hospital within 2 midnights of admission.   * I certify that at the point of admission it is my clinical judgment that the patient will require inpatient hospital care spanning beyond 2 midnights from the point of admission due to high intensity of service, high risk for further deterioration and high frequency of surveillance required.*  Author: Andris Baumann, MD 10/14/2022 4:35 AM  For on call review [email or web address].

## 2022-10-14 NOTE — ED Notes (Signed)
Informed RN bed assigned 

## 2022-10-14 NOTE — Progress Notes (Signed)
*  PRELIMINARY RESULTS* Echocardiogram 2D Echocardiogram has been performed.  Carolyne Fiscal 10/14/2022, 2:19 PM

## 2022-10-14 NOTE — Assessment & Plan Note (Addendum)
States he no longer drinks

## 2022-10-14 NOTE — ED Notes (Signed)
Echo being completed at bedside at this time.

## 2022-10-14 NOTE — Consult Note (Signed)
Pharmacy Consult Note - Anticoagulation  Pharmacy Consult for heparin Indication: atrial fibrillation  PATIENT MEASUREMENTS: Height: 5\' 8"  (172.7 cm) Weight: 79 kg (174 lb 2.6 oz) IBW/kg (Calculated) : 68.4 HEPARIN DW (KG): 79  VITAL SIGNS: Temp: 98 F (36.7 C) (09/16 1515) Temp Source: Oral (09/16 1515) BP: 108/74 (09/16 1715) Pulse Rate: 123 (09/16 1715)  Recent Labs    10/14/22 0227 10/14/22 0227 10/14/22 0243 10/14/22 0629  HGB 15.2  --  15.0  --   HCT 45.4  --  44.9  --   PLT 263  --  245  --   APTT  --   --  28  --   LABPROT  --    < > 14.4 15.4*  INR  --    < > 1.1 1.2  CREATININE 0.99  --   --   --   TROPONINIHS 17  --   --  14   < > = values in this interval not displayed.    Estimated Creatinine Clearance: 70.1 mL/min (by C-G formula based on SCr of 0.99 mg/dL).  PAST MEDICAL HISTORY: Past Medical History:  Diagnosis Date   Alcohol abuse    Depression    Tobacco abuse     ASSESSMENT: 67 y.o. male with PMH homelessness, EtOH misuse, MDD/GAD, chronic pain, HTN, Aflutter (previously on Xarelto), COPD, HFpEF  is presenting with atrial fibrillation. HR currently in the 120s. While patient has previously been on rivaroxaban for Aflutter, he is not on chronic anticoagulation per chart review. Last fill date was in 02/2021. Pharmacy has been consulted to initiate and manage heparin intravenous infusion.  Pertinent medications: Enoxaparin 40 mg [place] x 1 on 9/16 No chronic AC PTA per chart review Previously on rivaroxaban and amiodarone for Aflutter  Goal(s) of therapy: Heparin level 0.3 - 0.7 units/mL Monitor platelets by anticoagulation protocol: Yes   Baseline anticoagulation labs: Recent Labs    10/14/22 0227 10/14/22 0243 10/14/22 0629  APTT  --  28  --   INR  --  1.1 1.2  HGB 15.2 15.0  --   PLT 263 245  --     Date Time aPTT/HL Rate/Comment      PLAN: Give 2400 units bolus x1; then start heparin infusion at 1150 units/hour. Check heparin  level in 6 hours, then daily once at least two levels are consecutively therapeutic. Monitor CBC daily while on heparin infusion.  Will M. Dareen Piano, PharmD Clinical Pharmacist 10/14/2022 5:26 PM

## 2022-10-14 NOTE — Assessment & Plan Note (Addendum)
Sepsis Patient was tachycardic and had a rectal temp of 100.2, WBC 15,000 but normal lactic acid and procalcitonin Sepsis fluids Continue Rocephin and vancomycin Keep legs elevated

## 2022-10-14 NOTE — Progress Notes (Signed)
PHARMACY -  BRIEF ANTIBIOTIC NOTE   Pharmacy has received consult(s) for Vancomycin from an ED provider.  The patient's profile has been reviewed for ht/wt/allergies/indication/available labs.    One time order(s) placed for Vancomycin 1750 mg IV X 1   Further antibiotics/pharmacy consults should be ordered by admitting physician if indicated.                       Thank you, Vasil Juhasz D 10/14/2022  2:49 AM

## 2022-10-14 NOTE — ED Notes (Signed)
Ultrasound at bedside

## 2022-10-14 NOTE — Consult Note (Signed)
Cardiology Consultation   Patient ID: FINNIAN ALBANEZ MRN: [id number]; DOB: [birth year]  Admit date: 10/14/2022 Date of Consult: 10/14/2022  PCP:  Mick Sell, MD   [facility] Providers Cardiologist:  New   Patient Profile:   Jack Barber is a 67 y.o. male with a hx of tobacco use, homelessness, alcohol abuse, atrial flutter who is being seen 10/14/2022 for the evaluation of atrial fibrillation at the request of Dr. Lucianne Muss.  History of Present Illness:   Mr. Yohey was seen by cardiology in 2023 for atrial flutter.  Patient does not regularly see a doctor or go to checkups.  He does not take care of himself.  He smokes a pack daily.  He drinks alcohol a couple times a week.  Patient was admitted in February 2023 with shortness of breath found to have a flutter RVR started on IV amiodarone with conversion to normal sinus rhythm.  Patient was found to have possible underlying pulmonary infection and he was cocaine positive.  Normal TSH. CHA2DS2-VASc of 1 for hypertension.  Echo showed normal LVEF.  Plan was for 1 month of amiodarone with EP visit after this to consider ablation.  He was given 1 bottle of Xarelto to finish.  Patient did not follow-up.  Patient presented to the ER on 10/14/2022 for swollen feet bilaterally. Reports he has been unable to follow-up due to needing a ride. He denies recent alcohol use. He is still smoking. He denies chest pain or shortness of breath. Says he has been very stressed. He reports he lives where he is able to afford.   In the ER blood pressure 120/73, pulse rate 142 bpm, respiratory rate 20, afebrile.  Labs showed blood glucose 105, WBC 16.7, lactic acid 1.1, normal Pro-Cal.  Chest x-ray nonacute.  EKG shows a flutter with a heart rate of 141, RBBB.  High-sensitivity troponin negative x 2.  Ethanol normal.  Patient was given IV fluids and Tylenol for pain.  Started on antibiotics and admitted for further workup.   Past  Medical History:  Diagnosis Date   Alcohol abuse    Depression    Tobacco abuse     History reviewed. No pertinent surgical history.   Home Medications:  Prior to Admission medications   Medication Sig Start Date End Date Taking? Authorizing Provider  acetaminophen (TYLENOL) 325 MG tablet Take 2 tablets (650 mg total) by mouth every 4 (four) hours as needed for headache or mild pain. 03/27/21  Yes Sheikh, Omair Latif, DO  diclofenac (VOLTAREN) 75 MG EC tablet Take 75 mg by mouth 2 (two) times daily. 06/14/22  Yes [provider]  ibuprofen (ADVIL) 800 MG tablet Take 800 mg by mouth every 6 (six) hours as needed. 06/03/22  Yes [provider]  amiodarone (PACERONE) 200 MG tablet Take 1 tablet (200 mg total) by mouth 2 (two) times daily. Patient not taking: Reported on 10/14/2022 03/27/21   Azalee Course, PA  DULoxetine (CYMBALTA) 60 MG capsule Take 60 mg by mouth daily. Patient not taking: Reported on 10/14/2022 06/03/22   [provider]  fluticasone furoate-vilanterol (BREO ELLIPTA) 200-25 MCG/ACT AEPB Inhale 1 puff into the lungs daily. Patient not taking: Reported on 10/14/2022 03/28/21   Marguerita Merles Latif, DO  nicotine (NICODERM CQ - DOSED IN MG/24 HOURS) 21 mg/24hr patch Place 1 patch (21 mg total) onto the skin daily. Patient not taking: Reported on 10/14/2022 03/28/21   Marguerita Merles Latif, DO  omeprazole (PRILOSEC) 20 MG  capsule Take 20 mg by mouth daily. Patient not taking: Reported on 10/14/2022 06/14/22   [provider]  predniSONE (DELTASONE) 10 MG tablet Take 3 tablets once a day for 5 days Patient not taking: Reported on 10/14/2022 06/21/21   Tommi Rumps, PA-C  rivaroxaban (XARELTO) 20 MG TABS tablet Take 1 tablet (20 mg total) by mouth daily with supper. Patient not taking: Reported on 10/14/2022 03/26/21   Azalee Course, PA    Inpatient Medications: Scheduled Meds:  enoxaparin (LOVENOX) injection  40 mg Subcutaneous Q24H   Continuous Infusions:   [START ON 10/15/2022] cefTRIAXone (ROCEPHIN)  IV     lactated ringers     [START ON 10/15/2022] vancomycin     PRN Meds: acetaminophen **OR** acetaminophen, albuterol, HYDROcodone-acetaminophen, ketorolac, ondansetron **OR** ondansetron (ZOFRAN) IV  Allergies:    Allergies  Allergen Reactions   Codeine Itching    Social History:   Social History   Socioeconomic History   Marital status: Single    Spouse name: Not on file   Number of children: Not on file   Years of education: Not on file   Highest education level: Not on file  Occupational History   Not on file  Tobacco Use   Smoking status: Every Day    Current packs/day: 1.00    Types: Cigarettes   Smokeless tobacco: Not on file  Substance and Sexual Activity   Alcohol use: Not Currently    Alcohol/week: 1.0 standard drink of alcohol    Types: 1 Cans of beer per week    Comment: ATTENDING AA   Drug use: Not Currently   Sexual activity: Not on file  Other Topics Concern   Not on file  Social History Narrative   Not on file   Social Determinants of Health   Financial Resource Strain: Not on file  Food Insecurity: Not on file  Transportation Needs: Not on file  Physical Activity: Not on file  Stress: Not on file  Social Connections: Not on file  Intimate Partner Violence: Not on file    Family History:    Family History  Problem Relation Age of Onset   Heart attack Father    Heart attack Brother      ROS:  Please see the history of present illness.   All other ROS reviewed and negative.     Physical Exam/Data:   Vitals:   10/14/22 0530 10/14/22 0600 10/14/22 0700 10/14/22 0802  BP: 100/74 98/71 108/87   Pulse: 66 (!) 132 87   Resp: 20 19 19    Temp:    (!) 97.5 F (36.4 C)  TempSrc:    Oral  SpO2: 96% 95% 94%   Weight:      Height:        Intake/Output Summary (Last 24 hours) at 10/14/2022 1030 Last data filed at 10/14/2022 0801 Gross per 24 hour  Intake 2950 ml  Output --  Net 2950 ml       10/14/2022    2:07 AM 06/21/2021    1:20 PM 03/27/2021    4:13 AM  Last 3 Weights  Weight (lbs) 174 lb 2.6 oz 175 lb 187 lb 6.3 oz  Weight (kg) 79 kg 79.379 kg 85 kg     Body mass index is 26.48 kg/m.  General:  Well nourished, well developed, in no acute distress HEENT: normal Neck: no JVD Vascular: No carotid bruits; Distal pulses 2+ bilaterally Cardiac:  normal S1, S2; Irreg Irreg, tachy; no murmur  Lungs:  diffusely diminished  Abd: soft, nontender, no hepatomegaly  Ext: no edema Musculoskeletal:  No deformities, BUE and BLE strength normal and equal Skin: warm and dry  Neuro:  CNs 2-12 intact, no focal abnormalities noted Psych:  Normal affect   EKG:  The EKG was personally reviewed and demonstrates:  Atrial flutter, 141bpm, RBBB nonspecific ST/T wave changes Telemetry:  Telemetry was personally reviewed and demonstrates:  Afib flutter HR 120-160s  Relevant CV Studies:  Echo 04/2021 1. Left ventricular ejection fraction, by estimation, is 55 to 60%. The  left ventricle has normal function. The left ventricle has no regional  wall motion abnormalities. Left ventricular diastolic parameters are  consistent with Grade II diastolic  dysfunction (pseudonormalization).   2. Right ventricular systolic function is normal. The right ventricular  size is normal. There is mildly elevated pulmonary artery systolic  pressure. The estimated right ventricular systolic pressure is 39.8 mmHg.   3. The mitral valve is normal in structure. Mild mitral valve  regurgitation. No evidence of mitral stenosis.   4. Tricuspid valve regurgitation is moderate.   5. The aortic valve is calcified. There is mild calcification of the  aortic valve. Aortic valve regurgitation is not visualized. Aortic valve  sclerosis is present, with no evidence of aortic valve stenosis.   6. The inferior vena cava is dilated in size with <50% respiratory  variability, suggesting right atrial pressure of 15  mmHg.   Laboratory Data:  High Sensitivity Troponin:   Recent Labs  Lab 10/14/22 0227 10/14/22 0629  TROPONINIHS 17 14     Chemistry Recent Labs  Lab 10/14/22 0227  NA 139  K 4.5  CL 106  CO2 27  GLUCOSE 105*  BUN 23  CREATININE 0.99  CALCIUM 9.7  GFRNONAA >60  ANIONGAP 6    Recent Labs  Lab 10/14/22 0243  PROT 6.3*  ALBUMIN 4.2  AST 26  ALT 27  ALKPHOS 48  BILITOT 1.1   Lipids No results for input(s): "CHOL", "TRIG", "HDL", "LABVLDL", "LDLCALC", "CHOLHDL" in the last 168 hours.  Hematology Recent Labs  Lab 10/14/22 0227 10/14/22 0243  WBC 16.7* 15.3*  RBC 4.73 4.59  HGB 15.2 15.0  HCT 45.4 44.9  MCV 96.0 97.8  MCH 32.1 32.7  MCHC 33.5 33.4  RDW 13.6 13.7  PLT 263 245   Thyroid No results for input(s): "TSH", "FREET4" in the last 168 hours.  BNPNo results for input(s): "BNP", "PROBNP" in the last 168 hours.  DDimer No results for input(s): "DDIMER" in the last 168 hours.   Radiology/Studies:  DG Chest Port 1 View  Result Date: 10/14/2022 CLINICAL DATA:  Tachycardia EXAM: PORTABLE CHEST 1 VIEW COMPARISON:  03/25/2021 FINDINGS: Cardiac shadows within normal limits. Lungs are well aerated bilaterally. No focal infiltrate or effusion is seen. No bony abnormality is noted. IMPRESSION: No active disease. Electronically Signed   By: Alcide Clever M.D.   On: 10/14/2022 03:03     Assessment and Plan:   Atrial flutter - h/o of aflutter on 2023 converted with IV amio and plan was for short term Xarelto/amio with close follow-up with EP, however patient did not follow-up - now in aflutter/fib RVR, unknown chronicity - CHADSVASC of 1. Not on blood thinner PTA - on Lovenox [place] - can start IV amiodarone given elevated rates and now low BPs, however patient is not folly anticoagulated - check an echo - check TSH, Mag. Keep K>4  Bilateral lower leg cellulitis Sepsis - IV abx  and IVF per IM - DVT study ordered  Alcohol abuse - denies recent alcohol  use  Tobacco use - smokes 1ppd  For questions or updates, please contact [facility] Please consult [email or web address] for contact info under    Signed, Loukas Antonson David Stall, PA-C  10/14/2022 10:30 AM

## 2022-10-14 NOTE — Sepsis Progress Note (Signed)
Elink monitoring for the code sepsis protocol.  

## 2022-10-14 NOTE — Assessment & Plan Note (Addendum)
Patient was previously on amiodarone and Xarelto but no longer follows up and has not take the medication in a long time Not currently in A-fib flutter

## 2022-10-14 NOTE — Assessment & Plan Note (Signed)
Not acutely exacerbated DuoNebs as needed

## 2022-10-14 NOTE — ED Triage Notes (Signed)
Pt to ed [place] [facility] via [facility] for ant bites to his feet. Pt is caox4, in no acute distress and in wheel chair in triage with all of his belongings with him. Pt is homeless.

## 2022-10-14 NOTE — Progress Notes (Signed)
CODE SEPSIS - PHARMACY COMMUNICATION  **Broad Spectrum Antibiotics should be administered within 1 hour of Sepsis diagnosis**  Time Code Sepsis Called/Page Received: 9/16 @ 0239  Antibiotics Ordered: Ceftriaxone 2 gm , Vancomycin   Time of 1st antibiotic administration: Ceftriaxone 2 gm IV X 1 on 9/16 @ 0256  Additional action taken by pharmacy:   If necessary, Name of Provider/Nurse Contacted:     Job Holtsclaw D ,PharmD Clinical Pharmacist  10/14/2022  3:06 AM

## 2022-10-14 NOTE — ED Notes (Signed)
Called pharmacy at this time about midodrine since last dose was at 1421 if it should be given or held pharmacy stated to give med around 1800 timeframe but to still give medication.

## 2022-10-14 NOTE — ED Notes (Signed)
PA Furth at bedside from heart team.

## 2022-10-14 NOTE — Progress Notes (Signed)
Pharmacy Antibiotic Note  Jack Barber is a 67 y.o. male admitted on 10/14/2022 with cellulitis.  Pharmacy has been consulted for Vancomycin dosing.  Plan: Vancomycin 1750 mg IV X 1 given in ED on 9/16 @ 0402. Vancomycin 1750 mg IV Q24H ordered to start on 9/17 @ 0400.  AUC = 491.9 Vanc trough = 10  Height: 5\' 8"  (172.7 cm) Weight: 79 kg (174 lb 2.6 oz) IBW/kg (Calculated) : 68.4  Temp (24hrs), Avg:99.1 F (37.3 C), Min:98 F (36.7 C), Max:100.2 F (37.9 C)  Recent Labs  Lab 10/14/22 0227 10/14/22 0243  WBC 16.7* 15.3*  CREATININE 0.99  --   LATICACIDVEN  --  1.1    Estimated Creatinine Clearance: 70.1 mL/min (by C-G formula based on SCr of 0.99 mg/dL).    Allergies  Allergen Reactions   Codeine Itching    Antimicrobials this admission:   >>    >>   Dose adjustments this admission:   Microbiology results:  BCx:   UCx:    Sputum:    MRSA PCR:   Thank you for allowing pharmacy to be a part of this patient's care.  Radames Mejorado D 10/14/2022 4:53 AM

## 2022-10-15 ENCOUNTER — Other Ambulatory Visit (HOSPITAL_COMMUNITY): Payer: Self-pay

## 2022-10-15 DIAGNOSIS — J42 Unspecified chronic bronchitis: Secondary | ICD-10-CM

## 2022-10-15 DIAGNOSIS — Z720 Tobacco use: Secondary | ICD-10-CM

## 2022-10-15 DIAGNOSIS — L03116 Cellulitis of left lower limb: Secondary | ICD-10-CM

## 2022-10-15 DIAGNOSIS — Z87898 Personal history of other specified conditions: Secondary | ICD-10-CM | POA: Diagnosis not present

## 2022-10-15 DIAGNOSIS — I4891 Unspecified atrial fibrillation: Secondary | ICD-10-CM

## 2022-10-15 DIAGNOSIS — I5021 Acute systolic (congestive) heart failure: Secondary | ICD-10-CM | POA: Diagnosis not present

## 2022-10-15 DIAGNOSIS — L03115 Cellulitis of right lower limb: Secondary | ICD-10-CM

## 2022-10-15 DIAGNOSIS — Z5900 Homelessness unspecified: Secondary | ICD-10-CM

## 2022-10-15 DIAGNOSIS — I4892 Unspecified atrial flutter: Secondary | ICD-10-CM | POA: Diagnosis not present

## 2022-10-15 DIAGNOSIS — I5023 Acute on chronic systolic (congestive) heart failure: Secondary | ICD-10-CM

## 2022-10-15 LAB — BRAIN NATRIURETIC PEPTIDE: B Natriuretic Peptide: 228.8 pg/mL — ABNORMAL HIGH (ref 0.0–100.0)

## 2022-10-15 LAB — BASIC METABOLIC PANEL
Anion gap: 6 (ref 5–15)
BUN: 19 mg/dL (ref 8–23)
CO2: 25 mmol/L (ref 22–32)
Calcium: 8.8 mg/dL — ABNORMAL LOW (ref 8.9–10.3)
Chloride: 106 mmol/L (ref 98–111)
Creatinine, Ser: 0.94 mg/dL (ref 0.61–1.24)
GFR, Estimated: >60 mL/min (ref >60)
Glucose, Bld: 118 mg/dL — ABNORMAL HIGH (ref 70–99)
Potassium: 4.1 mmol/L (ref 3.5–5.1)
Sodium: 137 mmol/L (ref 135–145)

## 2022-10-15 LAB — CBC
HCT: 38.7 % — ABNORMAL LOW (ref 39.0–52.0)
Hemoglobin: 13 g/dL (ref 13.0–17.0)
MCH: 32.2 pg (ref 26.0–34.0)
MCHC: 33.6 g/dL (ref 30.0–36.0)
MCV: 95.8 fL (ref 80.0–100.0)
Platelets: 215 10*3/uL (ref 150–400)
RBC: 4.04 MIL/uL — ABNORMAL LOW (ref 4.22–5.81)
RDW: 13.9 % (ref 11.5–15.5)
WBC: 10.4 10*3/uL (ref 4.0–10.5)
nRBC: 0 % (ref 0.0–0.2)

## 2022-10-15 LAB — PHOSPHORUS: Phosphorus: 3.9 mg/dL (ref 2.5–4.6)

## 2022-10-15 LAB — HEPARIN LEVEL (UNFRACTIONATED)
Heparin Unfractionated: 0.2 [IU]/mL — ABNORMAL LOW (ref 0.30–0.70)
Heparin Unfractionated: 0.23 [IU]/mL — ABNORMAL LOW (ref 0.30–0.70)
Heparin Unfractionated: 0.45 [IU]/mL (ref 0.30–0.70)
Heparin Unfractionated: 0.49 [IU]/mL (ref 0.30–0.70)

## 2022-10-15 LAB — MAGNESIUM: Magnesium: 2.1 mg/dL (ref 1.7–2.4)

## 2022-10-15 MED ORDER — HEPARIN BOLUS VIA INFUSION
1200.0000 [IU] | Freq: Once | INTRAVENOUS | Status: AC
  Administered 2022-10-15: 1200 [IU] via INTRAVENOUS
  Filled 2022-10-15: qty 1200

## 2022-10-15 MED ORDER — HYALURONIDASE HUMAN 150 UNIT/ML IJ SOLN
150.0000 [IU] | Freq: Once | INTRAMUSCULAR | Status: AC
  Administered 2022-10-15: 150 [IU] via SUBCUTANEOUS
  Filled 2022-10-15: qty 1

## 2022-10-15 MED ORDER — HEPARIN BOLUS VIA INFUSION
2400.0000 [IU] | Freq: Once | INTRAVENOUS | Status: AC
  Administered 2022-10-15: 2400 [IU] via INTRAVENOUS
  Filled 2022-10-15: qty 2400

## 2022-10-15 NOTE — Progress Notes (Signed)
  Chaplain On-Call responded to Spiritual Care Consult Order from Gillis Santa, MD. The request was to provide Advance Directives information to the patient.  Chaplain met the patient and offered the AD documents and education to him. Chaplain explained the process for completion of the documents if patient chooses to do this while in the hospital. The patient stated his understanding.  Chaplain provided spiritual and emotional support.  Chaplain Evelena Peat M.Div., [facility]

## 2022-10-15 NOTE — Consult Note (Signed)
Pharmacy Consult Note - Anticoagulation  Pharmacy Consult for heparin Indication: atrial fibrillation  PATIENT MEASUREMENTS: Height: 5\' 8"  (172.7 cm) Weight: 79 kg (174 lb 2.6 oz) IBW/kg (Calculated) : 68.4 HEPARIN DW (KG): 79  VITAL SIGNS: Temp: 97.7 F (36.5 C) (09/17 0828) Temp Source: Oral (09/17 0828) BP: 97/72 (09/17 0845) Pulse Rate: 111 (09/17 0845)  Recent Labs    10/14/22 0243 10/14/22 0629 10/15/22 0012 10/15/22 0536 10/15/22 0928  HGB 15.0  --   --  13.0  --   HCT 44.9  --   --  38.7*  --   PLT 245  --   --  215  --   APTT 28  --   --   --   --   LABPROT 14.4 15.4*  --   --   --   INR 1.1 1.2  --   --   --   HEPARINUNFRC  --   --    < >  --  0.20*  CREATININE  --   --   --  0.94  --   TROPONINIHS  --  14  --   --   --    < > = values in this interval not displayed.    Estimated Creatinine Clearance: 73.8 mL/min (by C-G formula based on SCr of 0.94 mg/dL).  PAST MEDICAL HISTORY: Past Medical History:  Diagnosis Date   Alcohol abuse    Depression    Tobacco abuse     ASSESSMENT: 67 y.o. male with PMH homelessness, EtOH misuse, MDD/GAD, chronic pain, HTN, Aflutter (previously on Xarelto), COPD, HFpEF  is presenting with atrial fibrillation. HR currently in the 120s. While patient has previously been on rivaroxaban for Aflutter, he is not on chronic anticoagulation per chart review. Last fill date was in 02/2021. Pharmacy has been consulted to initiate and manage heparin intravenous infusion.  Pertinent medications: Enoxaparin 40 mg [place] x 1 on 9/16 No chronic AC PTA per chart review Previously on rivaroxaban and amiodarone for Aflutter  Goal(s) of therapy: Heparin level 0.3 - 0.7 units/mL Monitor platelets by anticoagulation protocol: Yes   Baseline anticoagulation labs: Recent Labs    10/14/22 0227 10/14/22 0243 10/14/22 0629 10/15/22 0536  APTT  --  28  --   --   INR  --  1.1 1.2  --   HGB 15.2 15.0  --  13.0  PLT 263 245  --  215     Date Time aPTT/HL Rate/Comment 9/17 0012 HL 0.23 Subtherapeutic 9/17 0928 HL 0.20 Subtherapeutic.      PLAN: Heparin level is subtherapeutic. Will give heparin bolus of 2400 units x 1 and increase the heparin infusion to 1450 units/hr. Recheck heparin level in 6 hours. CBC daily while on heparin.   Paschal Dopp, PharmD,  10/15/2022 10:07 AM

## 2022-10-15 NOTE — Progress Notes (Signed)
[facility] HF Stewardship  PCP: Mick Sell, MD  PCP-Cardiologist: None  HPI: Jack Barber is a 67 y.o. male with Homelessness, depression, alcohol use disorder, chronic pain, HTN, A-flutter previously on amiodarone and Xarelto, COPD who presented with pain, swelling, and redness of bilateral lower extremities. On admission, temperature noted to be 100.2 and WBC 15K. Started on vancomycin and ceftriaxone. EKG showed atrial flutter with RBBB. Troponin negative on admission. TTE showed LVEF of 30-35%, normal RV, severe TR, moderate MR, and aortic sclerosis without stenosis. BNP mildly elevated at 228.8 this AM after 2 days of IV Lasix.  Pertinent cardiac history: Diagnosed with atrial flutter in 2023 and converted to sinus rhythm with amiodarone. LVEF at that time showed normal LVEF. Was planning EP evaluation for ablation, but was lost to follow-up.  Pertinent Lab Values: Creatinine, Ser  Date Value Ref Range Status  10/15/2022 0.94 0.61 - 1.24 mg/dL Final   BUN  Date Value Ref Range Status  10/15/2022 19 8 - 23 mg/dL Final   Potassium  Date Value Ref Range Status  10/15/2022 4.1 3.5 - 5.1 mmol/L Final   Sodium  Date Value Ref Range Status  10/15/2022 137 135 - 145 mmol/L Final   B Natriuretic Peptide  Date Value Ref Range Status  10/15/2022 228.8 (H) 0.0 - 100.0 pg/mL Final    Comment:    Performed at [facility], [address] [place], [place] [address]   Magnesium  Date Value Ref Range Status  10/15/2022 2.1 1.7 - 2.4 mg/dL Final    Comment:    Performed at [facility], [address] [place], [place] [address]   Hgb A1c MFr Bld  Date Value Ref Range Status  03/23/2021 5.2 4.8 - 5.6 % Final    Comment:    (NOTE) Pre diabetes:          5.7%-6.4%  Diabetes:              >6.4%  Glycemic control for   <7.0% adults with diabetes    TSH  Date Value Ref Range Status  10/14/2022 0.868 0.350 - 4.500 uIU/mL Final    Comment:     Performed by a 3rd Generation assay with a functional sensitivity of <=0.01 uIU/mL. Performed at [facility], [address] [place], [place] [address]     Vital Signs: Temp:  [97.7 F (36.5 C)-98.5 F (36.9 C)] 97.7 F (36.5 C) (09/17 0828) Pulse Rate:  [25-133] 111 (09/17 0845) Cardiac Rhythm: Atrial fibrillation;Bundle branch block (09/16 2112) Resp:  [15-38] 16 (09/17 0828) BP: (59-123)/(40-87) 97/72 (09/17 0845) SpO2:  [91 %-100 %] 96 % (09/17 0828)   Intake/Output Summary (Last 24 hours) at 10/15/2022 1021 Last data filed at 10/15/2022 0825 Gross per 24 hour  Intake --  Output 3400 ml  Net -3400 ml    Current Inpatient Medications:  -Furosemide 40 mg IV BID -Meotprolol tartrate 12.5 mg BID  Prior to admission HF Medications:  -None  Assessment: 1. Acute heart failure (LVEF 30-35%), due to presumed NICM. NYHA class III-IV symptoms.  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 Remains tachycardic in atrial flutter. Being treated with amiodarone gtt and cardiology planning TEE guided DCCV when euvolemic. -BP is low this AM. Unable to add GDMT at this time. -Good urine output on furosemide 40 mg IV BID, however per I/Os remains barely net negative. No daily weight yet today. Still with orthopnea, but overall improvement to shortness of breath. Can consider  increasing furosemide to 60 mg per dose. Receiving high daily volume from IVs. -Started on low dose metoprolol for rate control. Will need to convert to metoprolol succinate prior to discharge.  Plan: 1) Medication changes recommended at this time: -Can consider increasing furosemide 60 mg IV BID  2) Patient assistance: -Copays for Sherryll Burger, Farxiga, and Jardiance are $0.  -Patient has housing instability and no transportation -Unfortunately does not meet income cutoff for Medicare extra help, but may qualify for manufacturer's assistance.  3) Education: - Patient has been educated on  current HF medications and potential additions to HF medication regimen - Patient verbalizes understanding that over the next few months, these medication doses may change and more medications may be added to optimize HF regimen - Patient has been educated on basic disease state pathophysiology and goals of therapy  Medication Assistance / Insurance Benefits Check:  Does the patient have prescription insurance? Prescription Insurance: MedicareMedicare  Type of insurance plan:   Does the patient qualify for medication assistance through manufacturers or grants? Yes   Eligible grants and/or patient assistance programs: Valla Leaver   Medication assistance applications in progress: none   Medication assistance applications approved: none  Approved medication assistance renewals will be completed by: pending  Outpatient Pharmacy:  Prior to admission outpatient pharmacy: none   Is the patient willing to utilize a [place] pharmacy at discharge?: Yes Thank you for involving pharmacy in this patient's care.  Enos Fling, PharmD, BCPS Phone - [phone] Clinical Pharmacist 10/15/2022 10:21 AM

## 2022-10-15 NOTE — Progress Notes (Signed)
Rounding Note    Patient Name: Jack Barber Date of Encounter: 10/15/2022  [facility] Cardiologist: None   Subjective   Patient remains in Afib RVR. He is overall feeling better. No chest pain or SOB. UOP -3.1L. He says since he is homeless, he is unsure where he will end up in the next month and if he can follow-up.   Inpatient Medications    Scheduled Meds:  fluticasone furoate-vilanterol  1 puff Inhalation Daily   furosemide  40 mg Intravenous Q12H   metoprolol tartrate  12.5 mg Oral BID   nicotine  21 mg Transdermal Daily   potassium chloride  20 mEq Oral BID   Continuous Infusions:  amiodarone 30 mg/hr (10/15/22 1330)   cefTRIAXone (ROCEPHIN)  IV 1 g (10/15/22 0251)   heparin 1,450 Units/hr (10/15/22 1330)   PRN Meds: acetaminophen **OR** acetaminophen, albuterol, HYDROcodone-acetaminophen, ketorolac, ondansetron **OR** ondansetron (ZOFRAN) IV   Vital Signs    Vitals:   10/15/22 0600 10/15/22 0828 10/15/22 0845 10/15/22 1105  BP:  97/82 97/72 108/72  Pulse: (!) 102 61 (!) 111 70  Resp:  16  16  Temp:  97.7 F (36.5 C)  97.7 F (36.5 C)  TempSrc:  Oral    SpO2:  96%  96%  Weight:      Height:        Intake/Output Summary (Last 24 hours) at 10/15/2022 1405 Last data filed at 10/15/2022 0825 Gross per 24 hour  Intake --  Output 3100 ml  Net -3100 ml      10/14/2022    2:07 AM 06/21/2021    1:20 PM 03/27/2021    4:13 AM  Last 3 Weights  Weight (lbs) 174 lb 2.6 oz 175 lb 187 lb 6.3 oz  Weight (kg) 79 kg 79.379 kg 85 kg      Telemetry    Afib HR 90-100 - Personally Reviewed  ECG    No new - Personally Reviewed  Physical Exam   GEN: No acute distress.   Neck: No JVD Cardiac: Irreg Irreg, no murmurs, rubs, or gallops.  Respiratory: diminished lung sounds. GI: Soft, nontender, non-distended  MS: minimal lower leg edema; No deformity. Neuro:  Nonfocal  Psych: Normal affect   Labs    High Sensitivity Troponin:   Recent Labs   Lab 10/14/22 0227 10/14/22 0629  TROPONINIHS 17 14     Chemistry Recent Labs  Lab 10/14/22 0227 10/14/22 0243 10/15/22 0536  NA 139  --  137  K 4.5  --  4.1  CL 106  --  106  CO2 27  --  25  GLUCOSE 105*  --  118*  BUN 23  --  19  CREATININE 0.99  --  0.94  CALCIUM 9.7  --  8.8*  MG  --   --  2.1  PROT  --  6.3*  --   ALBUMIN  --  4.2  --   AST  --  26  --   ALT  --  27  --   ALKPHOS  --  48  --   BILITOT  --  1.1  --   GFRNONAA >60  --  >60  ANIONGAP 6  --  6    Lipids No results for input(s): "CHOL", "TRIG", "HDL", "LABVLDL", "LDLCALC", "CHOLHDL" in the last 168 hours.  Hematology Recent Labs  Lab 10/14/22 0227 10/14/22 0243 10/15/22 0536  WBC 16.7* 15.3* 10.4  RBC 4.73 4.59 4.04*  HGB  15.2 15.0 13.0  HCT 45.4 44.9 38.7*  MCV 96.0 97.8 95.8  MCH 32.1 32.7 32.2  MCHC 33.5 33.4 33.6  RDW 13.6 13.7 13.9  PLT 263 245 215   Thyroid  Recent Labs  Lab 10/14/22 0411  TSH 0.868    BNP Recent Labs  Lab 10/15/22 0536  BNP 228.8*    DDimer  Recent Labs  Lab 10/14/22 1447  DDIMER <0.27     Radiology    CT Angio Chest Pulmonary Embolism (PE) W or WO Contrast  Result Date: 10/14/2022 CLINICAL DATA:  Suspected pulmonary embolism. EXAM: CT ANGIOGRAPHY CHEST WITH CONTRAST TECHNIQUE: Multidetector CT imaging of the chest was performed using the standard protocol during bolus administration of intravenous contrast. Multiplanar CT image reconstructions and MIPs were obtained to evaluate the vascular anatomy. RADIATION DOSE REDUCTION: This exam was performed according to the departmental dose-optimization program which includes automated exposure control, adjustment of the mA and/or kV according to patient size and/or use of iterative reconstruction technique. CONTRAST:  80mL OMNIPAQUE IOHEXOL 350 MG/ML SOLN COMPARISON:  None Available. FINDINGS: Cardiovascular: There is mild calcification of the aortic arch, without evidence of aortic aneurysm. Satisfactory  opacification of the pulmonary arteries to the segmental level. No evidence of pulmonary embolism. Normal heart size with mild to moderate severity coronary artery calcification. No pericardial effusion. Mediastinum/Nodes: No enlarged mediastinal, hilar, or axillary lymph nodes. Thyroid gland, trachea, and esophagus demonstrate no significant findings. Lungs/Pleura: There is no evidence of an acute infiltrate, pleural effusion or pneumothorax. Upper Abdomen: No acute abnormality. Musculoskeletal: Multilevel degenerative changes are seen throughout the thoracic spine. Review of the MIP images confirms the above findings. IMPRESSION: 1. No evidence of pulmonary embolism or acute cardiopulmonary disease. 2. Aortic atherosclerosis. Aortic Atherosclerosis (ICD10-I70.0). Electronically Signed   By: Aram Candela M.D.   On: 10/14/2022 17:45   ECHOCARDIOGRAM COMPLETE  Result Date: 10/14/2022    ECHOCARDIOGRAM REPORT   Patient Name:   Jack Barber Date of Exam: 10/14/2022 Medical Rec #:  [id number]          Height:       68.0 in Accession #:    [id number]         Weight:       174.2 lb Date of Birth:  1955-10-10         BSA:          1.927 m Patient Age:    67 years           BP:           72/61 mmHg Patient Gender: M                  HR:           106 bpm. Exam Location:  [facility] Procedure: 2D Echo, Cardiac Doppler and Color Doppler Indications:     Abnormal ECG  History:         Patient has prior history of Echocardiogram examinations, most                  recent 03/23/2021. Abnormal ECG; Arrythmias:Atrial Flutter. ETOH                  abuse.  Sonographer:     Mikki Harbor Referring Phys:  [id number] Gillis Santa Diagnosing Phys: Lorine Bears MD IMPRESSIONS  1. Left ventricular ejection fraction, by estimation, is 30 to 35%. The left ventricle has moderately decreased function. Left ventricular endocardial border not optimally  defined to evaluate regional wall motion. There is mild left ventricular  hypertrophy. Left ventricular diastolic parameters are indeterminate.  2. Right ventricular systolic function is normal. The right ventricular size is normal. There is normal pulmonary artery systolic pressure.  3. Left atrial size was mildly dilated.  4. Right atrial size was mildly dilated.  5. The mitral valve is normal in structure. Moderate mitral valve regurgitation. No evidence of mitral stenosis.  6. Tricuspid valve regurgitation is severe.  7. The aortic valve is calcified. Aortic valve regurgitation is not visualized. Aortic valve sclerosis/calcification is present, without any evidence of aortic stenosis.  8. The inferior vena cava is normal in size with <50% respiratory variability, suggesting right atrial pressure of 8 mmHg. FINDINGS  Left Ventricle: Left ventricular ejection fraction, by estimation, is 30 to 35%. The left ventricle has moderately decreased function. Left ventricular endocardial border not optimally defined to evaluate regional wall motion. The left ventricular internal cavity size was normal in size. There is mild left ventricular hypertrophy. Left ventricular diastolic parameters are indeterminate. Right Ventricle: The right ventricular size is normal. No increase in right ventricular wall thickness. Right ventricular systolic function is normal. There is normal pulmonary artery systolic pressure. The tricuspid regurgitant velocity is 2.50 m/s, and  with an assumed right atrial pressure of 8 mmHg, the estimated right ventricular systolic pressure is 33.0 mmHg. Left Atrium: Left atrial size was mildly dilated. Right Atrium: Right atrial size was mildly dilated. Pericardium: There is no evidence of pericardial effusion. Mitral Valve: The mitral valve is normal in structure. There is moderate thickening of the mitral valve leaflet(s). Moderate mitral valve regurgitation. No evidence of mitral valve stenosis. MV peak gradient, 1.9 mmHg. The mean mitral valve gradient is 1.0 mmHg.  Tricuspid Valve: The tricuspid valve is normal in structure. Tricuspid valve regurgitation is severe. No evidence of tricuspid stenosis. Aortic Valve: The aortic valve is calcified. Aortic valve regurgitation is not visualized. Aortic valve sclerosis/calcification is present, without any evidence of aortic stenosis. Aortic valve mean gradient measures 5.3 mmHg. Aortic valve peak gradient measures 8.6 mmHg. Aortic valve area, by VTI measures 1.82 cm. Pulmonic Valve: The pulmonic valve was normal in structure. Pulmonic valve regurgitation is not visualized. No evidence of pulmonic stenosis. Aorta: The aortic root is normal in size and structure. Venous: The inferior vena cava is normal in size with less than 50% respiratory variability, suggesting right atrial pressure of 8 mmHg. IAS/Shunts: No atrial level shunt detected by color flow Doppler.  LEFT VENTRICLE PLAX 2D LVIDd:         4.50 cm LVIDs:         3.50 cm LV PW:         1.00 cm LV IVS:        1.20 cm LVOT diam:     2.00 cm LV SV:         46 LV SV Index:   24 LVOT Area:     3.14 cm  RIGHT VENTRICLE RV Basal diam:  3.90 cm RV Mid diam:    3.30 cm RV S prime:     7.62 cm/s LEFT ATRIUM             Index        RIGHT ATRIUM           Index LA diam:        4.00 cm 2.08 cm/m   RA Area:     21.30 cm LA Vol (A2C):  58.6 ml 30.41 ml/m  RA Volume:   63.90 ml  33.16 ml/m LA Vol (A4C):   55.5 ml 28.80 ml/m LA Biplane Vol: 57.3 ml 29.73 ml/m  AORTIC VALVE AV Area (Vmax):    1.82 cm AV Area (Vmean):   1.51 cm AV Area (VTI):     1.82 cm AV Vmax:           146.33 cm/s AV Vmean:          105.967 cm/s AV VTI:            0.252 m AV Peak Grad:      8.6 mmHg AV Mean Grad:      5.3 mmHg LVOT Vmax:         84.83 cm/s LVOT Vmean:        50.933 cm/s LVOT VTI:          0.146 m LVOT/AV VTI ratio: 0.58  AORTA Ao Root diam: 3.30 cm MITRAL VALVE               TRICUSPID VALVE MV Area (PHT): 3.81 cm    TR Peak grad:   25.0 mmHg MV Area VTI:   2.60 cm    TR Vmax:        250.00  cm/s MV Peak grad:  1.9 mmHg MV Mean grad:  1.0 mmHg    SHUNTS MV Vmax:       0.68 m/s    Systemic VTI:  0.15 m MV Vmean:      36.3 cm/s   Systemic Diam: 2.00 cm MV Decel Time: 199 msec MV E velocity: 59.40 cm/s MV A velocity: 34.20 cm/s MV E/A ratio:  1.74 Lorine Bears MD Electronically signed by Lorine Bears MD Signature Date/Time: 10/14/2022/4:21:28 PM    Final    US Venous Img Lower Bilateral (DVT)  Result Date: 10/14/2022 CLINICAL DATA:  Bilateral lower extremity edema and pain. EXAM: BILATERAL LOWER EXTREMITY VENOUS DOPPLER ULTRASOUND TECHNIQUE: Gray-scale sonography with graded compression, as well as color Doppler and duplex ultrasound were performed to evaluate the lower extremity deep venous systems from the level of the common femoral vein and including the common femoral, femoral, profunda femoral, popliteal and calf veins including the posterior tibial, peroneal and gastrocnemius veins when visible. The superficial great saphenous vein was also interrogated. Spectral Doppler was utilized to evaluate flow at rest and with distal augmentation maneuvers in the common femoral, femoral and popliteal veins. COMPARISON:  None Available. FINDINGS: RIGHT LOWER EXTREMITY Common Femoral Vein: No evidence of thrombus. Normal compressibility, respiratory phasicity and response to augmentation. Saphenofemoral Junction: No evidence of thrombus. Normal compressibility and flow on color Doppler imaging. Profunda Femoral Vein: No evidence of thrombus. Normal compressibility and flow on color Doppler imaging. Femoral Vein: No evidence of thrombus. Normal compressibility, respiratory phasicity and response to augmentation. Popliteal Vein: No evidence of thrombus. Normal compressibility, respiratory phasicity and response to augmentation. Calf Veins: No evidence of thrombus. Normal compressibility and flow on color Doppler imaging. Superficial Great Saphenous Vein: No evidence of thrombus. Normal compressibility.  Venous Reflux:  None. Other Findings: No evidence of superficial thrombophlebitis or abnormal fluid collection. LEFT LOWER EXTREMITY Common Femoral Vein: No evidence of thrombus. Normal compressibility, respiratory phasicity and response to augmentation. Saphenofemoral Junction: No evidence of thrombus. Normal compressibility and flow on color Doppler imaging. Profunda Femoral Vein: No evidence of thrombus. Normal compressibility and flow on color Doppler imaging. Femoral Vein: No evidence of thrombus. Normal compressibility, respiratory phasicity and response to  augmentation. Popliteal Vein: No evidence of thrombus. Normal compressibility, respiratory phasicity and response to augmentation. Calf Veins: No evidence of thrombus. Normal compressibility and flow on color Doppler imaging. Superficial Great Saphenous Vein: No evidence of thrombus. Normal compressibility. Venous Reflux:  None. Other Findings: No evidence of superficial thrombophlebitis or abnormal fluid collection. IMPRESSION: No evidence of deep venous thrombosis in either lower extremity. Electronically Signed   By: Irish Lack M.D.   On: 10/14/2022 15:42   DG Chest Port 1 View  Result Date: 10/14/2022 CLINICAL DATA:  Tachycardia EXAM: PORTABLE CHEST 1 VIEW COMPARISON:  03/25/2021 FINDINGS: Cardiac shadows within normal limits. Lungs are well aerated bilaterally. No focal infiltrate or effusion is seen. No bony abnormality is noted. IMPRESSION: No active disease. Electronically Signed   By: Alcide Clever M.D.   On: 10/14/2022 03:03    Cardiac Studies   Echo 10/14/22 1. Left ventricular ejection fraction, by estimation, is 30 to 35%. The  left ventricle has moderately decreased function. Left ventricular  endocardial border not optimally defined to evaluate regional wall motion.  There is mild left ventricular  hypertrophy. Left ventricular diastolic parameters are indeterminate.   2. Right ventricular systolic function is normal. The  right ventricular  size is normal. There is normal pulmonary artery systolic pressure.   3. Left atrial size was mildly dilated.   4. Right atrial size was mildly dilated.   5. The mitral valve is normal in structure. Moderate mitral valve  regurgitation. No evidence of mitral stenosis.   6. Tricuspid valve regurgitation is severe.   7. The aortic valve is calcified. Aortic valve regurgitation is not  visualized. Aortic valve sclerosis/calcification is present, without any  evidence of aortic stenosis.   8. The inferior vena cava is normal in size with <50% respiratory  variability, suggesting right atrial pressure of 8 mmHg.   Echo 04/2021 1. Left ventricular ejection fraction, by estimation, is 55 to 60%. The  left ventricle has normal function. The left ventricle has no regional  wall motion abnormalities. Left ventricular diastolic parameters are  consistent with Grade II diastolic  dysfunction (pseudonormalization).   2. Right ventricular systolic function is normal. The right ventricular  size is normal. There is mildly elevated pulmonary artery systolic  pressure. The estimated right ventricular systolic pressure is 39.8 mmHg.   3. The mitral valve is normal in structure. Mild mitral valve  regurgitation. No evidence of mitral stenosis.   4. Tricuspid valve regurgitation is moderate.   5. The aortic valve is calcified. There is mild calcification of the  aortic valve. Aortic valve regurgitation is not visualized. Aortic valve  sclerosis is present, with no evidence of aortic valve stenosis.   6. The inferior vena cava is dilated in size with <50% respiratory  variability, suggesting right atrial pressure of 15 mmHg.   Patient Profile     67 y.o. male with a hx of tobacco use, homelessness, alcohol abuse, atrial flutter who is being seen 10/14/2022 for the evaluation of atrial fibrillation   Assessment & Plan    Atrial flutter - h/o of aflutter on 2023 converted with IV amio  and plan was for short term Xarelto/amio with close follow-up with EP, however patient did not follow-up - now in aflutter/fib RVR, unknown chronicity - CHADSVASC of 1. Not on blood thinner PTA - started on IV heparin - rates were elevated and Bs low and the patient was started on IV amiodarone - started on Lopressor 12.5mg  BID - he  remains in afib with better rates - echo showed reduced LVEF 30-35%, mild LVH, moderate MR, - check TSH, Mag. Keep K>4 - would try and wean amio - difficult situation given social issues,- homelessness, noncompliance, inability to follow-up. Can consider TEE/DCCV to see if patient holds SR, however unsure he will continue a/c or rate control meds.   HFrEF - Echo this admission showed LVEF 30-35%, mild LVH, moderate ME - in the setting of afib RVR with unknown chronicity - started on IV lasix 40mg  BID - Good UOP - continue diuresis - suspect low EF is from afib, but cannot rule out CAD. May want to cath while he is here. Complicated situation. Would want to restore SR then re-check echo, however follow-up is an issues.    Bilateral lower leg cellulitis Sepsis - IV abx and IVF per IM - DVT study negative   Alcohol abuse - denies recent alcohol use   Tobacco use - smokes 1ppd  For questions or updates, please contact [facility] Please consult [email or web address] for contact info under        Signed, Banks Chaikin David Stall, PA-C  10/15/2022, 2:05 PM

## 2022-10-15 NOTE — Consult Note (Signed)
Pharmacy Consult Note - Anticoagulation  Pharmacy Consult for heparin Indication: atrial fibrillation  PATIENT MEASUREMENTS: Height: 5\' 8"  (172.7 cm) Weight: 79 kg (174 lb 2.6 oz) IBW/kg (Calculated) : 68.4 HEPARIN DW (KG): 79  VITAL SIGNS: Temp: 97.8 F (36.6 C) (09/17 1639) Temp Source: Oral (09/17 0828) BP: 150/138 (09/17 1639) Pulse Rate: 57 (09/17 1639)  Recent Labs    10/14/22 0243 10/14/22 0629 10/15/22 0012 10/15/22 0536 10/15/22 0928 10/15/22 1625  HGB 15.0  --   --  13.0  --   --   HCT 44.9  --   --  38.7*  --   --   PLT 245  --   --  215  --   --   APTT 28  --   --   --   --   --   LABPROT 14.4 15.4*  --   --   --   --   INR 1.1 1.2  --   --   --   --   HEPARINUNFRC  --   --    < >  --    < > 0.45  CREATININE  --   --   --  0.94  --   --   TROPONINIHS  --  14  --   --   --   --    < > = values in this interval not displayed.    Estimated Creatinine Clearance: 73.8 mL/min (by C-G formula based on SCr of 0.94 mg/dL).  PAST MEDICAL HISTORY: Past Medical History:  Diagnosis Date   Alcohol abuse    Depression    Tobacco abuse     ASSESSMENT: 67 y.o. male with PMH homelessness, EtOH misuse, MDD/GAD, chronic pain, HTN, Aflutter (previously on Xarelto), COPD, HFpEF  is presenting with atrial fibrillation. HR currently in the 120s. While patient has previously been on rivaroxaban for Aflutter, he is not on chronic anticoagulation per chart review. Last fill date was in 02/2021. Pharmacy has been consulted to initiate and manage heparin intravenous infusion.  Pertinent medications: Enoxaparin 40 mg Epping x 1 on 9/16 No chronic AC PTA per chart review Previously on rivaroxaban and amiodarone for Aflutter  Goal(s) of therapy: Heparin level 0.3 - 0.7 units/mL Monitor platelets by anticoagulation protocol: Yes   Baseline anticoagulation labs: Recent Labs    10/14/22 0227 10/14/22 0243 10/14/22 0629 10/15/22 0536  APTT  --  28  --   --   INR  --  1.1 1.2   --   HGB 15.2 15.0  --  13.0  PLT 263 245  --  215    Date Time aPTT/HL Rate/Comment 9/17 0012 HL 0.23 Subtherapeutic 9/17 0928 HL 0.20 Subtherapeutic.  9/17 1625 HL 0.45 Therapeutic x 1 @ 1450 units/hr     PLAN: Heparin level is therapeutic. Will continue the heparin infusion at 1450 units/hr. Recheck heparin level in 6 hours to confirm. CBC daily while on heparin.   Sharen Hones, PharmD, BCPS Clinical Pharmacist   10/15/2022 5:44 PM

## 2022-10-15 NOTE — Progress Notes (Signed)
32 mmol/L 25  27  25    Calcium 8.9 - 10.3 mg/dL 8.8  9.7  8.9    CT Angio Chest Pulmonary Embolism (PE) W or WO Contrast  Result Date: 10/14/2022 CLINICAL DATA:  Suspected pulmonary embolism. EXAM: CT ANGIOGRAPHY CHEST WITH CONTRAST TECHNIQUE: Multidetector CT imaging of the chest was performed using the standard protocol during bolus administration of intravenous contrast. Multiplanar CT image reconstructions and MIPs were obtained to evaluate the vascular anatomy. RADIATION DOSE REDUCTION: This exam was performed according to the departmental dose-optimization program which includes automated exposure control, adjustment of the mA and/or kV according to patient size and/or use of iterative reconstruction technique. CONTRAST:  80mL OMNIPAQUE IOHEXOL 350 MG/ML SOLN COMPARISON:  None Available. FINDINGS: Cardiovascular: There is mild  calcification of the aortic arch, without evidence of aortic aneurysm. Satisfactory opacification of the pulmonary arteries to the segmental level. No evidence of pulmonary embolism. Normal heart size with mild to moderate severity coronary artery calcification. No pericardial effusion. Mediastinum/Nodes: No enlarged mediastinal, hilar, or axillary lymph nodes. Thyroid gland, trachea, and esophagus demonstrate no significant findings. Lungs/Pleura: There is no evidence of an acute infiltrate, pleural effusion or pneumothorax. Upper Abdomen: No acute abnormality. Musculoskeletal: Multilevel degenerative changes are seen throughout the thoracic spine. Review of the MIP images confirms the above findings. IMPRESSION: 1. No evidence of pulmonary embolism or acute cardiopulmonary disease. 2. Aortic atherosclerosis. Aortic Atherosclerosis (ICD10-I70.0). Electronically Signed   By: Aram Candela M.D.   On: 10/14/2022 17:45   ECHOCARDIOGRAM COMPLETE  Result Date: 10/14/2022    ECHOCARDIOGRAM REPORT   Patient Name:   Jack Barber Date of Exam: 10/14/2022 Medical Rec #:  [id number]          Height:       68.0 in Accession #:    [id number]         Weight:       174.2 lb Date of Birth:  07/26/1955         BSA:          1.927 m Patient Age:    66 years           BP:           72/61 mmHg Patient Gender: M                  HR:           106 bpm. Exam Location:  [facility] Procedure: 2D Echo, Cardiac Doppler and Color Doppler Indications:     Abnormal ECG  History:         Patient has prior history of Echocardiogram examinations, most                  recent 03/23/2021. Abnormal ECG; Arrythmias:Atrial Flutter. ETOH                  abuse.  Sonographer:     Mikki Harbor Referring Phys:  [id number] Gillis Santa Diagnosing Phys: Lorine Bears MD IMPRESSIONS  1. Left ventricular ejection fraction, by estimation, is 30 to 35%. The left ventricle has moderately decreased function. Left ventricular endocardial border not optimally  defined to evaluate regional wall motion. There is mild left ventricular hypertrophy. Left ventricular diastolic parameters are indeterminate.  2. Right ventricular systolic function is normal. The right ventricular size is normal. There is normal pulmonary artery systolic pressure.  3. Left atrial size was mildly dilated.  4. Right atrial size was mildly dilated.  5. The  32 mmol/L 25  27  25    Calcium 8.9 - 10.3 mg/dL 8.8  9.7  8.9    CT Angio Chest Pulmonary Embolism (PE) W or WO Contrast  Result Date: 10/14/2022 CLINICAL DATA:  Suspected pulmonary embolism. EXAM: CT ANGIOGRAPHY CHEST WITH CONTRAST TECHNIQUE: Multidetector CT imaging of the chest was performed using the standard protocol during bolus administration of intravenous contrast. Multiplanar CT image reconstructions and MIPs were obtained to evaluate the vascular anatomy. RADIATION DOSE REDUCTION: This exam was performed according to the departmental dose-optimization program which includes automated exposure control, adjustment of the mA and/or kV according to patient size and/or use of iterative reconstruction technique. CONTRAST:  80mL OMNIPAQUE IOHEXOL 350 MG/ML SOLN COMPARISON:  None Available. FINDINGS: Cardiovascular: There is mild  calcification of the aortic arch, without evidence of aortic aneurysm. Satisfactory opacification of the pulmonary arteries to the segmental level. No evidence of pulmonary embolism. Normal heart size with mild to moderate severity coronary artery calcification. No pericardial effusion. Mediastinum/Nodes: No enlarged mediastinal, hilar, or axillary lymph nodes. Thyroid gland, trachea, and esophagus demonstrate no significant findings. Lungs/Pleura: There is no evidence of an acute infiltrate, pleural effusion or pneumothorax. Upper Abdomen: No acute abnormality. Musculoskeletal: Multilevel degenerative changes are seen throughout the thoracic spine. Review of the MIP images confirms the above findings. IMPRESSION: 1. No evidence of pulmonary embolism or acute cardiopulmonary disease. 2. Aortic atherosclerosis. Aortic Atherosclerosis (ICD10-I70.0). Electronically Signed   By: Aram Candela M.D.   On: 10/14/2022 17:45   ECHOCARDIOGRAM COMPLETE  Result Date: 10/14/2022    ECHOCARDIOGRAM REPORT   Patient Name:   Jack Barber Date of Exam: 10/14/2022 Medical Rec #:  [id number]          Height:       68.0 in Accession #:    [id number]         Weight:       174.2 lb Date of Birth:  07/26/1955         BSA:          1.927 m Patient Age:    66 years           BP:           72/61 mmHg Patient Gender: M                  HR:           106 bpm. Exam Location:  [facility] Procedure: 2D Echo, Cardiac Doppler and Color Doppler Indications:     Abnormal ECG  History:         Patient has prior history of Echocardiogram examinations, most                  recent 03/23/2021. Abnormal ECG; Arrythmias:Atrial Flutter. ETOH                  abuse.  Sonographer:     Mikki Harbor Referring Phys:  [id number] Gillis Santa Diagnosing Phys: Lorine Bears MD IMPRESSIONS  1. Left ventricular ejection fraction, by estimation, is 30 to 35%. The left ventricle has moderately decreased function. Left ventricular endocardial border not optimally  defined to evaluate regional wall motion. There is mild left ventricular hypertrophy. Left ventricular diastolic parameters are indeterminate.  2. Right ventricular systolic function is normal. The right ventricular size is normal. There is normal pulmonary artery systolic pressure.  3. Left atrial size was mildly dilated.  4. Right atrial size was mildly dilated.  5. The  32 mmol/L 25  27  25    Calcium 8.9 - 10.3 mg/dL 8.8  9.7  8.9    CT Angio Chest Pulmonary Embolism (PE) W or WO Contrast  Result Date: 10/14/2022 CLINICAL DATA:  Suspected pulmonary embolism. EXAM: CT ANGIOGRAPHY CHEST WITH CONTRAST TECHNIQUE: Multidetector CT imaging of the chest was performed using the standard protocol during bolus administration of intravenous contrast. Multiplanar CT image reconstructions and MIPs were obtained to evaluate the vascular anatomy. RADIATION DOSE REDUCTION: This exam was performed according to the departmental dose-optimization program which includes automated exposure control, adjustment of the mA and/or kV according to patient size and/or use of iterative reconstruction technique. CONTRAST:  80mL OMNIPAQUE IOHEXOL 350 MG/ML SOLN COMPARISON:  None Available. FINDINGS: Cardiovascular: There is mild  calcification of the aortic arch, without evidence of aortic aneurysm. Satisfactory opacification of the pulmonary arteries to the segmental level. No evidence of pulmonary embolism. Normal heart size with mild to moderate severity coronary artery calcification. No pericardial effusion. Mediastinum/Nodes: No enlarged mediastinal, hilar, or axillary lymph nodes. Thyroid gland, trachea, and esophagus demonstrate no significant findings. Lungs/Pleura: There is no evidence of an acute infiltrate, pleural effusion or pneumothorax. Upper Abdomen: No acute abnormality. Musculoskeletal: Multilevel degenerative changes are seen throughout the thoracic spine. Review of the MIP images confirms the above findings. IMPRESSION: 1. No evidence of pulmonary embolism or acute cardiopulmonary disease. 2. Aortic atherosclerosis. Aortic Atherosclerosis (ICD10-I70.0). Electronically Signed   By: Aram Candela M.D.   On: 10/14/2022 17:45   ECHOCARDIOGRAM COMPLETE  Result Date: 10/14/2022    ECHOCARDIOGRAM REPORT   Patient Name:   Jack Barber Date of Exam: 10/14/2022 Medical Rec #:  [id number]          Height:       68.0 in Accession #:    [id number]         Weight:       174.2 lb Date of Birth:  07/26/1955         BSA:          1.927 m Patient Age:    66 years           BP:           72/61 mmHg Patient Gender: M                  HR:           106 bpm. Exam Location:  [facility] Procedure: 2D Echo, Cardiac Doppler and Color Doppler Indications:     Abnormal ECG  History:         Patient has prior history of Echocardiogram examinations, most                  recent 03/23/2021. Abnormal ECG; Arrythmias:Atrial Flutter. ETOH                  abuse.  Sonographer:     Mikki Harbor Referring Phys:  [id number] Gillis Santa Diagnosing Phys: Lorine Bears MD IMPRESSIONS  1. Left ventricular ejection fraction, by estimation, is 30 to 35%. The left ventricle has moderately decreased function. Left ventricular endocardial border not optimally  defined to evaluate regional wall motion. There is mild left ventricular hypertrophy. Left ventricular diastolic parameters are indeterminate.  2. Right ventricular systolic function is normal. The right ventricular size is normal. There is normal pulmonary artery systolic pressure.  3. Left atrial size was mildly dilated.  4. Right atrial size was mildly dilated.  5. The  32 mmol/L 25  27  25    Calcium 8.9 - 10.3 mg/dL 8.8  9.7  8.9    CT Angio Chest Pulmonary Embolism (PE) W or WO Contrast  Result Date: 10/14/2022 CLINICAL DATA:  Suspected pulmonary embolism. EXAM: CT ANGIOGRAPHY CHEST WITH CONTRAST TECHNIQUE: Multidetector CT imaging of the chest was performed using the standard protocol during bolus administration of intravenous contrast. Multiplanar CT image reconstructions and MIPs were obtained to evaluate the vascular anatomy. RADIATION DOSE REDUCTION: This exam was performed according to the departmental dose-optimization program which includes automated exposure control, adjustment of the mA and/or kV according to patient size and/or use of iterative reconstruction technique. CONTRAST:  80mL OMNIPAQUE IOHEXOL 350 MG/ML SOLN COMPARISON:  None Available. FINDINGS: Cardiovascular: There is mild  calcification of the aortic arch, without evidence of aortic aneurysm. Satisfactory opacification of the pulmonary arteries to the segmental level. No evidence of pulmonary embolism. Normal heart size with mild to moderate severity coronary artery calcification. No pericardial effusion. Mediastinum/Nodes: No enlarged mediastinal, hilar, or axillary lymph nodes. Thyroid gland, trachea, and esophagus demonstrate no significant findings. Lungs/Pleura: There is no evidence of an acute infiltrate, pleural effusion or pneumothorax. Upper Abdomen: No acute abnormality. Musculoskeletal: Multilevel degenerative changes are seen throughout the thoracic spine. Review of the MIP images confirms the above findings. IMPRESSION: 1. No evidence of pulmonary embolism or acute cardiopulmonary disease. 2. Aortic atherosclerosis. Aortic Atherosclerosis (ICD10-I70.0). Electronically Signed   By: Aram Candela M.D.   On: 10/14/2022 17:45   ECHOCARDIOGRAM COMPLETE  Result Date: 10/14/2022    ECHOCARDIOGRAM REPORT   Patient Name:   Jack Barber Date of Exam: 10/14/2022 Medical Rec #:  [id number]          Height:       68.0 in Accession #:    [id number]         Weight:       174.2 lb Date of Birth:  07/26/1955         BSA:          1.927 m Patient Age:    66 years           BP:           72/61 mmHg Patient Gender: M                  HR:           106 bpm. Exam Location:  [facility] Procedure: 2D Echo, Cardiac Doppler and Color Doppler Indications:     Abnormal ECG  History:         Patient has prior history of Echocardiogram examinations, most                  recent 03/23/2021. Abnormal ECG; Arrythmias:Atrial Flutter. ETOH                  abuse.  Sonographer:     Mikki Harbor Referring Phys:  [id number] Gillis Santa Diagnosing Phys: Lorine Bears MD IMPRESSIONS  1. Left ventricular ejection fraction, by estimation, is 30 to 35%. The left ventricle has moderately decreased function. Left ventricular endocardial border not optimally  defined to evaluate regional wall motion. There is mild left ventricular hypertrophy. Left ventricular diastolic parameters are indeterminate.  2. Right ventricular systolic function is normal. The right ventricular size is normal. There is normal pulmonary artery systolic pressure.  3. Left atrial size was mildly dilated.  4. Right atrial size was mildly dilated.  5. The  Progress Note   Patient: Jack Barber ZOX:[id number] DOB: 1955-12-10 DOA: 10/14/2022     1 DOS: the patient was seen and examined on 10/15/2022   Brief hospital course: MUSSIE ADGER is a 67 y.o. male with medical history significant for Homelessness, depression, alcohol use disorder, chronic pain, HTN, A-flutter previously on amiodarone and Xarelto, COPD, who presents to the ED with pain swelling and redness bilateral lower extremities.  He denies fever or chills has no shortness of breath.  He states his legs have always been swelling but over the last few days he was on his feet a lot and they became very painful. He was noted to be tachycardic with low grade temp admitted to hospitalist service for IV antibiotic therapy for leg cellulitis  Assessment and Plan: * Bilateral lower leg cellulitis Sepsis- ruled out. Tachycardic with Aflutter. Possible CHF causing leg swelling. Left leg more swollen and red than right. Normal lactic acid and procalcitonin Stop fluids. Continue Rocephin, stop vancomycin Keep legs elevated. DVT study negative.  Atrial Flutter with rapid ventricular response- He was on amio/ xarelto but did not follow up. Continue amio gtt with taper off. Started oral Lopressor therapy. Cardiology evaluation appreciated.  Acute on chronic systolic CHF- EF 81-19%. BNP high. Continue IV lasix 40mg  bid. Daily weights, strict input and output. Cardiology follow up.  Chronic bronchitis (HCC) Not acutely exacerbated DuoNebs as needed. Smoking cessation advised.  History of alcohol use disorder Tobacco abuse States he no longer drinks. Advised smoking cessation.      Subjective: Patient is seen and examined today morning. He is lying comfortably, denies shortness of breath or chest pain.  Physical Exam: Vitals:   10/15/22 0600 10/15/22 0828 10/15/22 0845 10/15/22 1105  BP:  97/82 97/72 108/72  Pulse: (!) 102 61 (!) 111 70  Resp:  16  16  Temp:  97.7  F (36.5 C)  97.7 F (36.5 C)  TempSrc:  Oral    SpO2:  96%  96%  Weight:      Height:       General - Elderly Caucasian weak male, no apparent distress HEENT - PERRLA, EOMI, pale conjunctiva, atraumatic head, non tender sinuses. Lung - Clear, diffuse rales Heart - S1, S2 heard, no murmurs, rubs, 1+ pedal edema Neuro - Alert, awake and oriented x 3, non focal exam. Skin - Warm and dry. Left >right leg swelling and redness Data Reviewed:     Latest Ref Rng & Units 10/15/2022    5:36 AM 10/14/2022    2:43 AM 10/14/2022    2:27 AM  CBC  WBC 4.0 - 10.5 K/uL 10.4  15.3  16.7   Hemoglobin 13.0 - 17.0 g/dL 14.7  82.9  56.2   Hematocrit 39.0 - 52.0 % 38.7  44.9  45.4   Platelets 150 - 400 K/uL 215  245  263        Latest Ref Rng & Units 10/15/2022    5:36 AM 10/14/2022    2:27 AM 03/27/2021    3:19 AM  BMP  Glucose 70 - 99 mg/dL 130  865  784   BUN 8 - 23 mg/dL 19  23  16    Creatinine 0.61 - 1.24 mg/dL 6.96  2.95  2.84   Sodium 135 - 145 mmol/L 137  139  139   Potassium 3.5 - 5.1 mmol/L 4.1  4.5  4.1   Chloride 98 - 111 mmol/L 106  106  104   CO2 22 -

## 2022-10-15 NOTE — Plan of Care (Signed)
  Problem: Clinical Measurements: Goal: Diagnostic test results will improve Outcome: Progressing Goal: Signs and symptoms of infection will decrease Outcome: Progressing   Problem: Respiratory: Goal: Ability to maintain adequate ventilation will improve Outcome: Progressing   Problem: Education: Goal: Knowledge of General Education information will improve Description: Including pain rating scale, medication(s)/side effects and non-pharmacologic comfort measures Outcome: Progressing   Problem: Health Behavior/Discharge Planning: Goal: Ability to manage health-related needs will improve Outcome: Progressing   Problem: Clinical Measurements: Goal: Will remain free from infection Outcome: Progressing Goal: Diagnostic test results will improve Outcome: Progressing

## 2022-10-15 NOTE — Consult Note (Signed)
Pharmacy Consult Note - Anticoagulation  Pharmacy Consult for heparin Indication: atrial fibrillation  PATIENT MEASUREMENTS: Height: 5\' 8"  (172.7 cm) Weight: 79 kg (174 lb 2.6 oz) IBW/kg (Calculated) : 68.4 HEPARIN DW (KG): 79  VITAL SIGNS: Temp: 98.5 F (36.9 C) (09/16 2337) Temp Source: Oral (09/16 1903) BP: 106/79 (09/16 2337) Pulse Rate: 84 (09/16 2337)  Recent Labs    10/14/22 0227 10/14/22 0227 10/14/22 0243 10/14/22 0629 10/15/22 0012  HGB 15.2  --  15.0  --   --   HCT 45.4  --  44.9  --   --   PLT 263  --  245  --   --   APTT  --   --  28  --   --   LABPROT  --    < > 14.4 15.4*  --   INR  --    < > 1.1 1.2  --   HEPARINUNFRC  --   --   --   --  0.23*  CREATININE 0.99  --   --   --   --   TROPONINIHS 17  --   --  14  --    < > = values in this interval not displayed.    Estimated Creatinine Clearance: 70.1 mL/min (by C-G formula based on SCr of 0.99 mg/dL).  PAST MEDICAL HISTORY: Past Medical History:  Diagnosis Date   Alcohol abuse    Depression    Tobacco abuse     ASSESSMENT: 67 y.o. male with PMH homelessness, EtOH misuse, MDD/GAD, chronic pain, HTN, Aflutter (previously on Xarelto), COPD, HFpEF  is presenting with atrial fibrillation. HR currently in the 120s. While patient has previously been on rivaroxaban for Aflutter, he is not on chronic anticoagulation per chart review. Last fill date was in 02/2021. Pharmacy has been consulted to initiate and manage heparin intravenous infusion.  Pertinent medications: Enoxaparin 40 mg [place] x 1 on 9/16 No chronic AC PTA per chart review Previously on rivaroxaban and amiodarone for Aflutter  Goal(s) of therapy: Heparin level 0.3 - 0.7 units/mL Monitor platelets by anticoagulation protocol: Yes   Baseline anticoagulation labs: Recent Labs    10/14/22 0227 10/14/22 0243 10/14/22 0629  APTT  --  28  --   INR  --  1.1 1.2  HGB 15.2 15.0  --   PLT 263 245  --      Date Time aPTT/HL Rate/Comment 9/17 0012 HL 0.23 Subtherapeutic     PLAN: Give 1200 units bolus x1; then increase heparin infusion to 1300 units/hour. Check heparin level in 6 hours, then daily once at least two levels are consecutively therapeutic. Monitor CBC daily while on heparin infusion.  Otelia Sergeant, PharmD, [facility] 10/15/2022 1:14 AM

## 2022-10-15 NOTE — TOC Benefit Eligibility Note (Signed)
Patient Product/process development scientist completed.    The patient is insured through [facility]. Patient has Medicare and is not eligible for a copay card, but may be able to apply for patient assistance, if available.    Ran test claim for Entresto 24-26 mg and the current 30 day co-pay is $47.00.  Ran test claim for Farxiga 10 mg and the current 30 day co-pay is $47.00.  Ran test claim for Jardiance 10 mg and the current 30 day co-pay is $47.00.   This test claim was processed through [facility]- copay amounts may vary at other pharmacies due to pharmacy/plan contracts, or as the patient moves through the different stages of their insurance plan.     Roland Earl, CPHT Pharmacy Technician III Certified Patient Advocate [facility] Patient Advocate Team Direct Number: [phone]  Fax: [phone]

## 2022-10-16 ENCOUNTER — Other Ambulatory Visit (HOSPITAL_COMMUNITY): Payer: Self-pay

## 2022-10-16 ENCOUNTER — Other Ambulatory Visit: Payer: Self-pay

## 2022-10-16 DIAGNOSIS — I5021 Acute systolic (congestive) heart failure: Secondary | ICD-10-CM

## 2022-10-16 DIAGNOSIS — R0602 Shortness of breath: Secondary | ICD-10-CM

## 2022-10-16 DIAGNOSIS — Z87898 Personal history of other specified conditions: Secondary | ICD-10-CM | POA: Diagnosis not present

## 2022-10-16 DIAGNOSIS — I4892 Unspecified atrial flutter: Secondary | ICD-10-CM

## 2022-10-16 DIAGNOSIS — A419 Sepsis, unspecified organism: Secondary | ICD-10-CM

## 2022-10-16 DIAGNOSIS — J42 Unspecified chronic bronchitis: Secondary | ICD-10-CM | POA: Diagnosis not present

## 2022-10-16 DIAGNOSIS — Z8679 Personal history of other diseases of the circulatory system: Secondary | ICD-10-CM

## 2022-10-16 DIAGNOSIS — I483 Typical atrial flutter: Secondary | ICD-10-CM | POA: Diagnosis not present

## 2022-10-16 DIAGNOSIS — L03116 Cellulitis of left lower limb: Secondary | ICD-10-CM | POA: Diagnosis not present

## 2022-10-16 LAB — HEPARIN LEVEL (UNFRACTIONATED): Heparin Unfractionated: 0.39 [IU]/mL (ref 0.30–0.70)

## 2022-10-16 MED ORDER — CEFADROXIL 500 MG PO CAPS
500.0000 mg | ORAL_CAPSULE | Freq: Two times a day (BID) | ORAL | Status: DC
  Administered 2022-10-17 – 2022-10-18 (×3): 500 mg via ORAL
  Filled 2022-10-16 (×3): qty 1

## 2022-10-16 MED ORDER — ALPRAZOLAM 0.25 MG PO TABS
0.2500 mg | ORAL_TABLET | Freq: Every evening | ORAL | Status: DC | PRN
  Administered 2022-10-16 (×2): 0.25 mg via ORAL
  Filled 2022-10-16 (×2): qty 1

## 2022-10-16 MED ORDER — APIXABAN 5 MG PO TABS
5.0000 mg | ORAL_TABLET | Freq: Two times a day (BID) | ORAL | 0 refills | Status: AC
  Filled 2022-10-16 (×2): qty 60, 30d supply, fill #0

## 2022-10-16 NOTE — Discharge Instructions (Signed)
Agency Name: [facility] Address: [address], [place], [place] [address] Phone: [phone] Website: [email or web address] Service(s) Offered: Housing services, self-sufficiency, congregate meal program, and individual development account program.  Agency Name: [facility] [place] Address: [address], [place], [place] [address] Phone: [phone] Email: [email or web address] Website: [email or web address] Service(s) Offered: Housing the homeless, feeding the hungry, Company secretary, job and education related services.  Agency Name: [facility] Address: [address], [place], [place] [address] Phone: [phone] Email: [email or web address] Service(s) Offered: Counseling, problem pregnancy, advocacy for Hispanics, limited emergency financial assistance.  Agency Name: Department of Social Services Address: [address] [place], [place] [address] Phone: [phone] Website: [email or web address] Service(s) Offered: Child support services; child welfare services; SNAP; Medicaid; work first family assistance; and aid with fuel,  rent, food and medicine.  Agency Name: Holiday representative Address: [address] [place], [place] [address] Phone: [phone] or [phone] Email: [email or web address] Service(s) Offered: Family services and transient assistance; emergency food, fuel, clothing, limited furniture, utilities; budget counseling, general counseling; give a kid a coat; thrift store; Christmas food and toys. Utility assistance, food pantry, rental  assistance, life sustaining medicine   Transportation Resources  Agency Name: [facility] Agency Address: [address] [place], [place] [address] Phone: [phone] Email: [email or web address] Website: [email or web address] Service(s) Offered: Housing services, self-sufficiency, congregate meal  program, weatherization program, Field seismologist program, emergency food assistance,  housing counseling, home ownership program, wheels-towork program.  Agency Name: [facility] [employer] [id number]) Address: [address], [place], [place] [address] Phone: [phone] Website: [email or web address].[email or web address] Service(s) Offered: Transportation for [employer], subscription and demand response; Dial-a-Ride for citizens 12 years of age or older.  Agency Name: Department of Social Services Address: [address] [place], [place] [address] Phone: [phone] Service(s) Offered: Child support services; child welfare services; food stamps; Medicaid; work first family assistance; and aid with fuel,  rent, food and medicine, transportation assistance.  Agency Name: Disabled [employer] (DAV) Transportation  Network Phone: [phone] Service(s) Offered: Transports veterans to the [facility] medical center. Call  forty-eight hours in advance and leave the name, telephone  number, date, and time of appointment. Veteran will be  contacted by the driver the day before the appointment to  arrange a pick up point   Transportation Resources  Agency Name: [facility] Agency Address: [address] [place], [place] [address] Phone: [phone] Email: [email or web address] Website: [email or web address] Service(s) Offered: Housing services, self-sufficiency, congregate meal program, weatherization program, Field seismologist program, emergency food assistance,  housing counseling, home ownership program, wheels-towork program.  Agency Name: [facility] [employer] [phone]) Address: [address], [place], [place] [address] Phone: [phone] Website: [email or web address].acta-[email or web address] Service(s) Offered: Transportation for [employer], subscription and demand response; Dial-a-Ride for citizens 60 years of  age or older.  Agency Name: Department of Social Services Address: [address] [place], [place] [address] Phone: [phone] Service(s) Offered: Child support services; child welfare services; food stamps; Medicaid; work first family assistance; and aid with fuel,  rent, food and medicine, transportation assistance.  Agency Name: Disabled [employer] (DAV) Transportation  Network Phone: [phone] Service(s) Offered: Transports veterans to the [facility] medical center. Call  forty-eight hours in advance and leave the name, telephone  number, date, and time of appointment. Veteran will be  contacted by the driver the day before the appointment to  arrange a pick up point    [employer] currently provides door to  door services. ACTA connects with PART daily for services to [facility]. ACTA also performs contract services to [employer] operates 27 vehicles, all but 3 mini-vans are equipped with lifts for special needs as well as the general public. ACTA drivers are each CDL certified and trained in First Aid and CPR. ACTA was established in 2002 by [employer]. An independent Industrial/product designer. ACTA operates via Cytogeneticist with required Research scientist (physical sciences) from [place]. ACTA provides over 80,000 passenger trips each year, including [facility] and [employer] sites.  Call at least by 11 AM one business day prior to needing transportation  [employer].                      [place], [place] [address]     Office Hours: Monday-Friday  8 AM - 5 PM   Rent/Utility/Housing  Agency Name: [facility] Agency Address: [address] [place], [place] [address] Phone: [phone] Email: [email or web address] Website: [email or web address] Service(s) Offered: Housing services, self-sufficiency,  congregate meal program, weatherization program, Field seismologist program, emergency food assistance,  housing counseling, home ownership program, wheels -towork program.  Agency Name: [facility] Address: [address] [place], [place] [address] Phone: [phone] (8a-4p) [phone] (8p- 10p) Email: [email or web address] Website: [email or web address] Service(s) Offered: A program for homeless and/or needy men that includes one-on-one counseling, life skills training and job rehabilitation.  Agency Name: [employer] [facility] [place] Address: [address], [place], [place] [address] Phone: [phone] Website: www.[email or web address].org Service(s) Offered: Assistance to needy in emergency with utility bills, heating fuel, and prescriptions. Shelter for homeless 7pm-7am. May 23, 2016 15  Agency Name: [facility] [place] (Developmentally Disabled) Address: [address], [place], [place] [address] Phone: [phone] Contact Person: Cathleen Corti Email: [email or web address] Website: [email or web address] Service(s) Offered: Helps individuals with developmental disabilities move from housing that is more restrictive to homes where they  can achieve greater independence and have more  opportunities.  Agency Name: [facility] Address: [address], [place], [place] [address] Phone: [phone] Email: [email or web address] Website: www.[email or web address].org Service(s) Offered: Provides affordable housing for low-income families, elderly, and disabled individuals. Offer a wide range of  programs and services, from financial planning to afterschool and summer programs.  Agency Name: Department of Social Services Address: [address] [place], [place] [address] Phone: [phone] Service(s) Offered: Child support services; child welfare services; food stamps; Medicaid; work first family  assistance; and aid with fuel,  rent, food and medicine.  Agency Name: [facility] [place] [employer]. Address: [facility] [address] [place], [place]  [address] Phone: [phone] Website: www.familyabuseservices.org Service(s) Offered: 24 hour Crisis Line: [phone]; 24 hour Emergency Shelter; Transitional Housing; Support Groups; Scientist, physiological; [employer]; Hispanic Outreach: [phone];  Visitation Center: [phone].  Agency Name: [facility] [place] [place]. Address: [address], [place], [place] [address] Phone: [phone] Service(s) Offered: CAP Services; Home and [employer]; Individual or Group Supports; Respite Care Non-Institutional Nursing;  Residential Supports; Respite Care and Personal Care Services; Transportation; Family and Friends Night; Recreational Activities; Three Nutritious Meals/Snacks; Consultation with Registered Dietician; Twenty-four hour Registered Nurse Access; Daily and [employer]; Camp Green Leaves; [place] for the [employer] (During Summer Months) Bingo Night (Every  Wednesday Night); Special Populations Dance Night  (Every Tuesday Night); Professional Hair Care Services.  Agency Name: [facility] Address: [address], [place], [place] [address] Phone: [phone] Contact Person: Jabier Mutton Website: [email or web address].Physicist, medical) Offered: Residential treatment facility for women; food and  clothing, educational & employment development and  transportation to work; Counsellor of financial skills;  parenting and family reunification; emotional and spiritual  support; transitional housing for program graduates.  Agency Name: [facility] Address: [address], [place], [place] [address] Phone: [phone] Email: [email or web address] Website: [email or web address].es Service(s) Offered: Public housing units for elderly,  disabled, and low income people; housing choice vouchers for income eligible  applicants; shelter plus care vouchers; and Psychologist, clinical.  Agency Name: [facility] [employer] Address: [address] [place], [place] [address] Phone: [phone] Email: [email or web address] Website: www.habitatalamance.org Service(s) Offered: Build houses for families in need of decent housing. Each adult in the family must invest 200 hours of labor on  someone else's house, work with volunteers to build their own house, attend classes on budgeting, home maintenance, yard care, and attend homeowner association meetings.  Agency Name: [facility] Address: [address] [place], [place] [address] Phone: [phone] Website: www.rsli.org Service(s) Offered: Intermediate care facilities for intellectually delayed, Supervised Living in group homes for adults with developmental disabilities, Supervised Living for people who have dual diagnoses (MRMI), Independent Living, Supported Living, respite and a variety of CAP services, pre-vocational services, day supports, and [employer].  Agency Name: [place] [employer] [facility] Phone: [phone] Website: www.NCForeclosurePrevention.gov Service(s) Offered: Zero-interest, deferred loans to homeowners struggling to pay their mortgage. Call for more information.   Food Resources  Agency Name: [facility] Agency Address: [address] [place], [place] [address] Phone: [phone] Website: www.alamanceservices.org Service(s) Offered: Housing services, self-sufficiency, congregate meal program, weatherization program, Event organiser program, emergency food assistance,  housing counseling, home ownership program, wheels - to work program.  Dole Food free for 60 and older at various locations from [employer], Monday-Friday:  [employer], [address]  [place], [phone] -[facility], [address], [place] [phone]  -[facility], [address] [place] [phone]  -[address], [address], [place], [phone]  Agency Name: [facility] on [facility] Address: [id number], [place], [place] [address] Phone: [phone] Website: www.[email or web address] Service(s) Offered: Home delivered hot, frozen, and emergency  meals. Grocery assistance program which matches  volunteers one-on-one with seniors unable to grocery shop  for themselves. Must be 60 years and older; less than 20  hours of in-home aide service, limited or no driving ability;  live alone or with someone with a disability; live in  [place].  Agency Name: [facility]) Address: [address], [place], [place] [address] Phone: [phone] Service(s) Offered: Food is served to shut-ins, homeless, elderly, and low income people in the community every Saturday (11:30 am-12:30 pm) and Sunday (12:30 pm-1:30pm). Volunteers also offer help and encouragement in seeking employment,  and spiritual guidance.  Agency Name: Department of Social Services Address: [address] [place], [place] [address] Phone: [phone] Service(s) Offered: Child support services; child welfare services; food stamps; Medicaid; work first family assistance; and aid with fuel,  rent, food and medicine.  Agency Name: [facility] Address: [address] [place], [place] Phone: [phone] Website: [email or web address] Services Offered: Monday 10:00am-12:00, 8:00pm-9:00pm, and Friday 10:00am-12:00.  Agency Name: [facility] [place] Address: [address] [place], [place] [address] Phone: [phone] Website: www.alliedchurches.org Service(s) Offered: Serves weekday meals, open  from 11:30 am- 1:00 pm., and 6:30-7:30pm, Monday-Wednesday-Friday distributes  food 3:30-6pm, Monday-Wednesday-Friday.  Agency Name: [facility] Address: [address], [place], [place] Phone: [phone] Website: [email or web address].[email or web address].[email or web address] Services Offered: Distributes food the 4th Saturday of the month, starting at 8:00 am  Agency Name: [facility] Address: [phone], [place], [place] [address] Phone: [phone] Website: [email or web address] Service(s) Offered: Bread of life, weekly food pantry. Open Wednesdays from 10:00am-noon.  Agency Name: [facility] [employer] [facility] Address: [address] [place], [place], [place] Phone: [phone] Services Offered: Distributes food 9am-1pm, Monday-Thursday. Call for details.  Agency Name: [facility] Address: [address] [place], [place] [address] Phone: [phone] Website: [email or web address].[email or web address] Service(s) Offered: Games developer. Call for assistance.  Agency Name: [facility] Address: [address] [place], [place] [address] Phone: [phone] Service Offered: Emergency Food Pantry. Call for appointment.  Agency Name: [facility] Address: [address] [place], [place] [address] Phone: [phone] Website: [email or web address] Services Offered: Games developer. Call for details  Agency Name: [facility] Address: [address] [place], [place] Phone: [phone] Website: [email or web address] Service(s) Offered: Emergency Food Pantry. Call for details.  Agency Name: Holiday representative Address: [address] [place], [place] [address] Phone: [phone] or [phone] Website: [email or web address] Service(s) Offered: Distribute food 9am-11:30 am, Tuesday-Friday, and 1-3:30pm, Monday-Friday. Food pantry Monday-Friday 1pm-3pm, fresh items, Mon.-Wed.-Fri.  Agency Name: [facility] ([facility] Address: [address] [place], [place] [address] Phone:  [phone] Website: [email or web address] Services Offered: Distribute food Tues and Sats from 9:00am-noon. Closed 1st Saturday of each month. Call for details

## 2022-10-16 NOTE — Progress Notes (Signed)
Rounding Note    Patient Name: Jack Barber Date of Encounter: 10/16/2022  [facility] Cardiologist: None   Subjective   Patient is feeling better today. Afib rates more controlled. Long discussion surrounding medications and housing at d/c. No chest pain or SOB reported. UOP -3.6L.  Inpatient Medications    Scheduled Meds:  fluticasone furoate-vilanterol  1 puff Inhalation Daily   furosemide  40 mg Intravenous Q12H   metoprolol tartrate  12.5 mg Oral BID   nicotine  21 mg Transdermal Daily   potassium chloride  20 mEq Oral BID   Continuous Infusions:  amiodarone 30 mg/hr (10/16/22 0324)   cefTRIAXone (ROCEPHIN)  IV 1 g (10/16/22 0321)   heparin 1,450 Units/hr (10/16/22 0704)   PRN Meds: acetaminophen **OR** acetaminophen, albuterol, ALPRAZolam, HYDROcodone-acetaminophen, ketorolac, ondansetron **OR** ondansetron (ZOFRAN) IV   Vital Signs    Vitals:   10/16/22 0043 10/16/22 0401 10/16/22 0402 10/16/22 0835  BP: 95/65 123/74  (!) 112/97  Pulse: 88 88  87  Resp: 18 16  (!) 24  Temp: 98.5 F (36.9 C) 97.8 F (36.6 C)  97.7 F (36.5 C)  TempSrc:  Oral    SpO2: 97% 96%  98%  Weight:   82.3 kg   Height:        Intake/Output Summary (Last 24 hours) at 10/16/2022 1036 Last data filed at 10/16/2022 0943 Gross per 24 hour  Intake 2203.46 ml  Output 3425 ml  Net -1221.54 ml      10/16/2022    4:02 AM 10/14/2022    2:07 AM 06/21/2021    1:20 PM  Last 3 Weights  Weight (lbs) 181 lb 7 oz 174 lb 2.6 oz 175 lb  Weight (kg) 82.3 kg 79 kg 79.379 kg      Telemetry    Afib/flutter HR 60-80s - Personally Reviewed  ECG    No new - Personally Reviewed  Physical Exam   GEN: No acute distress.   Neck: No JVD Cardiac: Irreg Irreg, no murmurs, rubs, or gallops.  Respiratory: Clear to auscultation bilaterally. GI: Soft, nontender, non-distended  MS: No edema; No deformity. Neuro:  Nonfocal  Psych: Normal affect   Labs    High Sensitivity  Troponin:   Recent Labs  Lab 10/14/22 0227 10/14/22 0629  TROPONINIHS 17 14     Chemistry Recent Labs  Lab 10/14/22 0227 10/14/22 0243 10/15/22 0536 10/16/22 0426  NA 139  --  137 138  K 4.5  --  4.1 4.0  CL 106  --  106 105  CO2 27  --  25 24  GLUCOSE 105*  --  118* 118*  BUN 23  --  19 18  CREATININE 0.99  --  0.94 1.04  CALCIUM 9.7  --  8.8* 8.9  MG  --   --  2.1 2.1  PROT  --  6.3*  --   --   ALBUMIN  --  4.2  --   --   AST  --  26  --   --   ALT  --  27  --   --   ALKPHOS  --  48  --   --   BILITOT  --  1.1  --   --   GFRNONAA >60  --  >60 >60  ANIONGAP 6  --  6 9    Lipids No results for input(s): "CHOL", "TRIG", "HDL", "LABVLDL", "LDLCALC", "CHOLHDL" in the last 168 hours.  Hematology Recent Labs  Lab 10/14/22 0243 10/15/22 0536 10/16/22 0426  WBC 15.3* 10.4 10.4  RBC 4.59 4.04* 4.07*  HGB 15.0 13.0 13.2  HCT 44.9 38.7* 39.6  MCV 97.8 95.8 97.3  MCH 32.7 32.2 32.4  MCHC 33.4 33.6 33.3  RDW 13.7 13.9 13.7  PLT 245 215 214   Thyroid  Recent Labs  Lab 10/14/22 0411  TSH 0.868    BNP Recent Labs  Lab 10/15/22 0536  BNP 228.8*    DDimer  Recent Labs  Lab 10/14/22 1447  DDIMER <0.27     Radiology    CT Angio Chest Pulmonary Embolism (PE) W or WO Contrast  Result Date: 10/14/2022 CLINICAL DATA:  Suspected pulmonary embolism. EXAM: CT ANGIOGRAPHY CHEST WITH CONTRAST TECHNIQUE: Multidetector CT imaging of the chest was performed using the standard protocol during bolus administration of intravenous contrast. Multiplanar CT image reconstructions and MIPs were obtained to evaluate the vascular anatomy. RADIATION DOSE REDUCTION: This exam was performed according to the departmental dose-optimization program which includes automated exposure control, adjustment of the mA and/or kV according to patient size and/or use of iterative reconstruction technique. CONTRAST:  80mL OMNIPAQUE IOHEXOL 350 MG/ML SOLN COMPARISON:  None Available. FINDINGS:  Cardiovascular: There is mild calcification of the aortic arch, without evidence of aortic aneurysm. Satisfactory opacification of the pulmonary arteries to the segmental level. No evidence of pulmonary embolism. Normal heart size with mild to moderate severity coronary artery calcification. No pericardial effusion. Mediastinum/Nodes: No enlarged mediastinal, hilar, or axillary lymph nodes. Thyroid gland, trachea, and esophagus demonstrate no significant findings. Lungs/Pleura: There is no evidence of an acute infiltrate, pleural effusion or pneumothorax. Upper Abdomen: No acute abnormality. Musculoskeletal: Multilevel degenerative changes are seen throughout the thoracic spine. Review of the MIP images confirms the above findings. IMPRESSION: 1. No evidence of pulmonary embolism or acute cardiopulmonary disease. 2. Aortic atherosclerosis. Aortic Atherosclerosis (ICD10-I70.0). Electronically Signed   By: Aram Candela M.D.   On: 10/14/2022 17:45   ECHOCARDIOGRAM COMPLETE  Result Date: 10/14/2022    ECHOCARDIOGRAM REPORT   Patient Name:   Jack Barber Date of Exam: 10/14/2022 Medical Rec #:  [id number]          Height:       68.0 in Accession #:    [id number]         Weight:       174.2 lb Date of Birth:  [birth year]         BSA:          1.927 m Patient Age:    67 years           BP:           72/61 mmHg Patient Gender: M                  HR:           106 bpm. Exam Location:  [facility] Procedure: 2D Echo, Cardiac Doppler and Color Doppler Indications:     Abnormal ECG  History:         Patient has prior history of Echocardiogram examinations, most                  recent 03/23/2021. Abnormal ECG; Arrythmias:Atrial Flutter. ETOH                  abuse.  Sonographer:     Mikki Harbor Referring Phys:  [id number] Gillis Santa Diagnosing Phys: Lorine Bears MD IMPRESSIONS  1. Left ventricular ejection fraction,  by estimation, is 30 to 35%. The left ventricle has moderately decreased function. Left ventricular  endocardial border not optimally defined to evaluate regional wall motion. There is mild left ventricular hypertrophy. Left ventricular diastolic parameters are indeterminate.  2. Right ventricular systolic function is normal. The right ventricular size is normal. There is normal pulmonary artery systolic pressure.  3. Left atrial size was mildly dilated.  4. Right atrial size was mildly dilated.  5. The mitral valve is normal in structure. Moderate mitral valve regurgitation. No evidence of mitral stenosis.  6. Tricuspid valve regurgitation is severe.  7. The aortic valve is calcified. Aortic valve regurgitation is not visualized. Aortic valve sclerosis/calcification is present, without any evidence of aortic stenosis.  8. The inferior vena cava is normal in size with <50% respiratory variability, suggesting right atrial pressure of 8 mmHg. FINDINGS  Left Ventricle: Left ventricular ejection fraction, by estimation, is 30 to 35%. The left ventricle has moderately decreased function. Left ventricular endocardial border not optimally defined to evaluate regional wall motion. The left ventricular internal cavity size was normal in size. There is mild left ventricular hypertrophy. Left ventricular diastolic parameters are indeterminate. Right Ventricle: The right ventricular size is normal. No increase in right ventricular wall thickness. Right ventricular systolic function is normal. There is normal pulmonary artery systolic pressure. The tricuspid regurgitant velocity is 2.50 m/s, and  with an assumed right atrial pressure of 8 mmHg, the estimated right ventricular systolic pressure is 33.0 mmHg. Left Atrium: Left atrial size was mildly dilated. Right Atrium: Right atrial size was mildly dilated. Pericardium: There is no evidence of pericardial effusion. Mitral Valve: The mitral valve is normal in structure. There is moderate thickening of the mitral valve leaflet(s). Moderate mitral valve regurgitation. No evidence  of mitral valve stenosis. MV peak gradient, 1.9 mmHg. The mean mitral valve gradient is 1.0 mmHg. Tricuspid Valve: The tricuspid valve is normal in structure. Tricuspid valve regurgitation is severe. No evidence of tricuspid stenosis. Aortic Valve: The aortic valve is calcified. Aortic valve regurgitation is not visualized. Aortic valve sclerosis/calcification is present, without any evidence of aortic stenosis. Aortic valve mean gradient measures 5.3 mmHg. Aortic valve peak gradient measures 8.6 mmHg. Aortic valve area, by VTI measures 1.82 cm. Pulmonic Valve: The pulmonic valve was normal in structure. Pulmonic valve regurgitation is not visualized. No evidence of pulmonic stenosis. Aorta: The aortic root is normal in size and structure. Venous: The inferior vena cava is normal in size with less than 50% respiratory variability, suggesting right atrial pressure of 8 mmHg. IAS/Shunts: No atrial level shunt detected by color flow Doppler.  LEFT VENTRICLE PLAX 2D LVIDd:         4.50 cm LVIDs:         3.50 cm LV PW:         1.00 cm LV IVS:        1.20 cm LVOT diam:     2.00 cm LV SV:         46 LV SV Index:   24 LVOT Area:     3.14 cm  RIGHT VENTRICLE RV Basal diam:  3.90 cm RV Mid diam:    3.30 cm RV S prime:     7.62 cm/s LEFT ATRIUM             Index        RIGHT ATRIUM           Index LA diam:  4.00 cm 2.08 cm/m   RA Area:     21.30 cm LA Vol (A2C):   58.6 ml 30.41 ml/m  RA Volume:   63.90 ml  33.16 ml/m LA Vol (A4C):   55.5 ml 28.80 ml/m LA Biplane Vol: 57.3 ml 29.73 ml/m  AORTIC VALVE AV Area (Vmax):    1.82 cm AV Area (Vmean):   1.51 cm AV Area (VTI):     1.82 cm AV Vmax:           146.33 cm/s AV Vmean:          105.967 cm/s AV VTI:            0.252 m AV Peak Grad:      8.6 mmHg AV Mean Grad:      5.3 mmHg LVOT Vmax:         84.83 cm/s LVOT Vmean:        50.933 cm/s LVOT VTI:          0.146 m LVOT/AV VTI ratio: 0.58  AORTA Ao Root diam: 3.30 cm MITRAL VALVE               TRICUSPID VALVE MV Area  (PHT): 3.81 cm    TR Peak grad:   25.0 mmHg MV Area VTI:   2.60 cm    TR Vmax:        250.00 cm/s MV Peak grad:  1.9 mmHg MV Mean grad:  1.0 mmHg    SHUNTS MV Vmax:       0.68 m/s    Systemic VTI:  0.15 m MV Vmean:      36.3 cm/s   Systemic Diam: 2.00 cm MV Decel Time: 199 msec MV E velocity: 59.40 cm/s MV A velocity: 34.20 cm/s MV E/A ratio:  1.74 Lorine Bears MD Electronically signed by Lorine Bears MD Signature Date/Time: 10/14/2022/4:21:28 PM    Final    US Venous Img Lower Bilateral (DVT)  Result Date: 10/14/2022 CLINICAL DATA:  Bilateral lower extremity edema and pain. EXAM: BILATERAL LOWER EXTREMITY VENOUS DOPPLER ULTRASOUND TECHNIQUE: Gray-scale sonography with graded compression, as well as color Doppler and duplex ultrasound were performed to evaluate the lower extremity deep venous systems from the level of the common femoral vein and including the common femoral, femoral, profunda femoral, popliteal and calf veins including the posterior tibial, peroneal and gastrocnemius veins when visible. The superficial great saphenous vein was also interrogated. Spectral Doppler was utilized to evaluate flow at rest and with distal augmentation maneuvers in the common femoral, femoral and popliteal veins. COMPARISON:  None Available. FINDINGS: RIGHT LOWER EXTREMITY Common Femoral Vein: No evidence of thrombus. Normal compressibility, respiratory phasicity and response to augmentation. Saphenofemoral Junction: No evidence of thrombus. Normal compressibility and flow on color Doppler imaging. Profunda Femoral Vein: No evidence of thrombus. Normal compressibility and flow on color Doppler imaging. Femoral Vein: No evidence of thrombus. Normal compressibility, respiratory phasicity and response to augmentation. Popliteal Vein: No evidence of thrombus. Normal compressibility, respiratory phasicity and response to augmentation. Calf Veins: No evidence of thrombus. Normal compressibility and flow on color Doppler  imaging. Superficial Great Saphenous Vein: No evidence of thrombus. Normal compressibility. Venous Reflux:  None. Other Findings: No evidence of superficial thrombophlebitis or abnormal fluid collection. LEFT LOWER EXTREMITY Common Femoral Vein: No evidence of thrombus. Normal compressibility, respiratory phasicity and response to augmentation. Saphenofemoral Junction: No evidence of thrombus. Normal compressibility and flow on color Doppler imaging. Profunda Femoral Vein: No evidence of thrombus. Normal compressibility  and flow on color Doppler imaging. Femoral Vein: No evidence of thrombus. Normal compressibility, respiratory phasicity and response to augmentation. Popliteal Vein: No evidence of thrombus. Normal compressibility, respiratory phasicity and response to augmentation. Calf Veins: No evidence of thrombus. Normal compressibility and flow on color Doppler imaging. Superficial Great Saphenous Vein: No evidence of thrombus. Normal compressibility. Venous Reflux:  None. Other Findings: No evidence of superficial thrombophlebitis or abnormal fluid collection. IMPRESSION: No evidence of deep venous thrombosis in either lower extremity. Electronically Signed   By: Irish Lack M.D.   On: 10/14/2022 15:42    Cardiac Studies   Echo 10/14/22 1. Left ventricular ejection fraction, by estimation, is 30 to 35%. The  left ventricle has moderately decreased function. Left ventricular  endocardial border not optimally defined to evaluate regional wall motion.  There is mild left ventricular  hypertrophy. Left ventricular diastolic parameters are indeterminate.   2. Right ventricular systolic function is normal. The right ventricular  size is normal. There is normal pulmonary artery systolic pressure.   3. Left atrial size was mildly dilated.   4. Right atrial size was mildly dilated.   5. The mitral valve is normal in structure. Moderate mitral valve  regurgitation. No evidence of mitral stenosis.   6.  Tricuspid valve regurgitation is severe.   7. The aortic valve is calcified. Aortic valve regurgitation is not  visualized. Aortic valve sclerosis/calcification is present, without any  evidence of aortic stenosis.   8. The inferior vena cava is normal in size with <50% respiratory  variability, suggesting right atrial pressure of 8 mmHg.    Echo 04/2021 1. Left ventricular ejection fraction, by estimation, is 55 to 60%. The  left ventricle has normal function. The left ventricle has no regional  wall motion abnormalities. Left ventricular diastolic parameters are  consistent with Grade II diastolic  dysfunction (pseudonormalization).   2. Right ventricular systolic function is normal. The right ventricular  size is normal. There is mildly elevated pulmonary artery systolic  pressure. The estimated right ventricular systolic pressure is 39.8 mmHg.   3. The mitral valve is normal in structure. Mild mitral valve  regurgitation. No evidence of mitral stenosis.   4. Tricuspid valve regurgitation is moderate.   5. The aortic valve is calcified. There is mild calcification of the  aortic valve. Aortic valve regurgitation is not visualized. Aortic valve  sclerosis is present, with no evidence of aortic valve stenosis.   6. The inferior vena cava is dilated in size with <50% respiratory  variability, suggesting right atrial pressure of 15 mmHg.     Patient Profile     67 y.o. male  with a hx of tobacco use, homelessness, alcohol abuse, atrial flutter who is being seen 10/14/2022 for the evaluation of atrial fibrillation   Assessment & Plan   Atrial flutter - h/o of aflutter on 2023 converted with IV amio and plan was for short term Xarelto/amio with close follow-up with EP, however patient did not follow-up - now in aflutter/fib RVR of unknown chronicity - CHADSVASC of 2. Not on blood thinner PTA - started on IV heparin - rates were elevated and Bs low and the patient was started on IV  amiodarone - continue Lopressor 12.5mg  BID - he remains in afib with controlled rates - echo showed reduced LVEF 30-35%, mild LVH, moderate MR, - TSH wnl, Mag. Keep K>4 - would try and wean amio as he is not the best candidate for this - difficult situation given social  issues,- homelessness, noncompliance, inability to follow-up. Long discussion surrounding medications and housing after he leaves. Plan for Eliquis for 30 days then Xarelto thereafter. We will set him up for TEE/DCCV tomorrow.   HFrEF - Echo this admission showed LVEF 30-35%, mild LVH, moderate ME - in the setting of afib RVR with unknown chronicity - UDS negative - started on IV lasix 40mg  BID - Good UOP - continue diuresis - suspect low EF is from afib, but cannot rule out CAD. Would want to restore SR then re-check echo.   Bilateral lower leg cellulitis Sepsis - IV abx and IVF per IM - DVT study negative   Alcohol abuse - denies recent alcohol use   Tobacco use - smokes 1ppd  For questions or updates, please contact [facility] Please consult [email or web address] for contact info under        Signed, Kathleen Tamm David Stall, PA-C  10/16/2022, 10:36 AM

## 2022-10-16 NOTE — Progress Notes (Signed)
Progress Note   Patient: Jack Barber YQM:[id number] DOB: 03-Aug-1955 DOA: 10/14/2022     2 DOS: the patient was seen and examined on 10/16/2022   Brief hospital course: Jack Barber is a 67 y.o. male with medical history significant for Homelessness, depression, alcohol use disorder, chronic pain, HTN, A-flutter previously on amiodarone and Xarelto, COPD, who presents to the ED with pain swelling and redness bilateral lower extremities.  He denies fever or chills has no shortness of breath.  He states his legs have always been swelling but over the last few days he was on his feet a lot and they became very painful. He was noted to be tachycardic with low grade temp admitted to hospitalist service for IV antibiotic therapy for leg cellulitis  Assessment and Plan: * Bilateral lower leg cellulitis Sepsis- ruled out. Tachycardic with Aflutter. Possible CHF causing leg swelling. Left leg more swollen and red than right. Normal lactic acid and procalcitonin Rocephin changed to Cefadroxil to finish 5 day antibiotic therapy, Keep legs elevated. DVT study negative.  Atrial Flutter with rapid ventricular response- He was on amio/ xarelto but did not follow up. Continue amio gtt with taper off. Started oral Lopressor therapy. Cardiology evaluation appreciated.  Plan for TEE cardioversion tomorrow.  Acute on chronic systolic CHF- EF 52-84%. BNP high. Continue IV lasix 40mg  bid. Daily weights, strict input and output. Cardiology advised outpatient ischemic workup.  Chronic bronchitis (HCC) Not acutely exacerbated DuoNebs as needed. Smoking cessation advised.  History of alcohol use disorder Tobacco abuse States he no longer drinks. Advised smoking cessation.      Subjective: Patient is seen and examined today morning. He is lying comfortably. He had IV line blew last night, possible extravasation of amio, got hyaluronidase. No chest pian, palpitations.  Physical  Exam: Vitals:   10/16/22 0401 10/16/22 0402 10/16/22 0835 10/16/22 1257  BP: 123/74  (!) 112/97 107/70  Pulse: 88  87 83  Resp: 16  (!) 24 (!) 27  Temp: 97.8 F (36.6 C)  97.7 F (36.5 C) (!) 97.5 F (36.4 C)  TempSrc: Oral     SpO2: 96%  98% 96%  Weight:  82.3 kg    Height:       General - Elderly Caucasian weak male, no apparent distress HEENT - PERRLA, EOMI, atraumatic head, non tender sinuses. Lung - Clear, diffuse rales Heart - S1, S2 heard, no murmurs, rubs, 1+ pedal edema Neuro - Alert, awake and oriented x 3, non focal exam. Skin - Warm and dry. Left >right leg swelling and redness Data Reviewed:     Latest Ref Rng & Units 10/16/2022    4:26 AM 10/15/2022    5:36 AM 10/14/2022    2:43 AM  CBC  WBC 4.0 - 10.5 K/uL 10.4  10.4  15.3   Hemoglobin 13.0 - 17.0 g/dL 13.2  44.0  10.2   Hematocrit 39.0 - 52.0 % 39.6  38.7  44.9   Platelets 150 - 400 K/uL 214  215  245        Latest Ref Rng & Units 10/16/2022    4:26 AM 10/15/2022    5:36 AM 10/14/2022    2:27 AM  BMP  Glucose 70 - 99 mg/dL 725  366  440   BUN 8 - 23 mg/dL 18  19  23    Creatinine 0.61 - 1.24 mg/dL 3.47  4.25  9.56   Sodium 135 - 145 mmol/L 138  137  139  Potassium 3.5 - 5.1 mmol/L 4.0  4.1  4.5   Chloride 98 - 111 mmol/L 105  106  106   CO2 22 - 32 mmol/L 24  25  27    Calcium 8.9 - 10.3 mg/dL 8.9  8.8  9.7    CT Angio Chest Pulmonary Embolism (PE) W or WO Contrast  Result Date: 10/14/2022 CLINICAL DATA:  Suspected pulmonary embolism. EXAM: CT ANGIOGRAPHY CHEST WITH CONTRAST TECHNIQUE: Multidetector CT imaging of the chest was performed using the standard protocol during bolus administration of intravenous contrast. Multiplanar CT image reconstructions and MIPs were obtained to evaluate the vascular anatomy. RADIATION DOSE REDUCTION: This exam was performed according to the departmental dose-optimization program which includes automated exposure control, adjustment of the mA and/or kV according to patient  size and/or use of iterative reconstruction technique. CONTRAST:  80mL OMNIPAQUE IOHEXOL 350 MG/ML SOLN COMPARISON:  None Available. FINDINGS: Cardiovascular: There is mild calcification of the aortic arch, without evidence of aortic aneurysm. Satisfactory opacification of the pulmonary arteries to the segmental level. No evidence of pulmonary embolism. Normal heart size with mild to moderate severity coronary artery calcification. No pericardial effusion. Mediastinum/Nodes: No enlarged mediastinal, hilar, or axillary lymph nodes. Thyroid gland, trachea, and esophagus demonstrate no significant findings. Lungs/Pleura: There is no evidence of an acute infiltrate, pleural effusion or pneumothorax. Upper Abdomen: No acute abnormality. Musculoskeletal: Multilevel degenerative changes are seen throughout the thoracic spine. Review of the MIP images confirms the above findings. IMPRESSION: 1. No evidence of pulmonary embolism or acute cardiopulmonary disease. 2. Aortic atherosclerosis. Aortic Atherosclerosis (ICD10-I70.0). Electronically Signed   By: Aram Candela M.D.   On: 10/14/2022 17:45   US Venous Img Lower Bilateral (DVT)  Result Date: 10/14/2022 CLINICAL DATA:  Bilateral lower extremity edema and pain. EXAM: BILATERAL LOWER EXTREMITY VENOUS DOPPLER ULTRASOUND TECHNIQUE: Gray-scale sonography with graded compression, as well as color Doppler and duplex ultrasound were performed to evaluate the lower extremity deep venous systems from the level of the common femoral vein and including the common femoral, femoral, profunda femoral, popliteal and calf veins including the posterior tibial, peroneal and gastrocnemius veins when visible. The superficial great saphenous vein was also interrogated. Spectral Doppler was utilized to evaluate flow at rest and with distal augmentation maneuvers in the common femoral, femoral and popliteal veins. COMPARISON:  None Available. FINDINGS: RIGHT LOWER EXTREMITY Common  Femoral Vein: No evidence of thrombus. Normal compressibility, respiratory phasicity and response to augmentation. Saphenofemoral Junction: No evidence of thrombus. Normal compressibility and flow on color Doppler imaging. Profunda Femoral Vein: No evidence of thrombus. Normal compressibility and flow on color Doppler imaging. Femoral Vein: No evidence of thrombus. Normal compressibility, respiratory phasicity and response to augmentation. Popliteal Vein: No evidence of thrombus. Normal compressibility, respiratory phasicity and response to augmentation. Calf Veins: No evidence of thrombus. Normal compressibility and flow on color Doppler imaging. Superficial Great Saphenous Vein: No evidence of thrombus. Normal compressibility. Venous Reflux:  None. Other Findings: No evidence of superficial thrombophlebitis or abnormal fluid collection. LEFT LOWER EXTREMITY Common Femoral Vein: No evidence of thrombus. Normal compressibility, respiratory phasicity and response to augmentation. Saphenofemoral Junction: No evidence of thrombus. Normal compressibility and flow on color Doppler imaging. Profunda Femoral Vein: No evidence of thrombus. Normal compressibility and flow on color Doppler imaging. Femoral Vein: No evidence of thrombus. Normal compressibility, respiratory phasicity and response to augmentation. Popliteal Vein: No evidence of thrombus. Normal compressibility, respiratory phasicity and response to augmentation. Calf Veins: No evidence of thrombus. Normal compressibility and  flow on color Doppler imaging. Superficial Great Saphenous Vein: No evidence of thrombus. Normal compressibility. Venous Reflux:  None. Other Findings: No evidence of superficial thrombophlebitis or abnormal fluid collection. IMPRESSION: No evidence of deep venous thrombosis in either lower extremity. Electronically Signed   By: Irish Lack M.D.   On: 10/14/2022 15:42    Family Communication: homeless, he understands current care  plan.  Disposition: Status is: Inpatient Remains inpatient appropriate because: cardiac work up, IV antibiotics  Planned Discharge Destination: Barriers to discharge: homeless    Time spent: 42 minutes  Author: Marcelino Duster, MD 10/16/2022 3:22 PM  For on call review [email or web address].

## 2022-10-16 NOTE — Progress Notes (Signed)
Incorrect pt

## 2022-10-16 NOTE — TOC Initial Note (Signed)
Transition of Care [facility]) - Initial/Assessment Note    Patient Details  Name: Jack Barber MRN: [id number] Date of Birth: 14-Feb-1955  Transition of [facility]) CM/SW Contact:    Truddie Hidden, RN Phone Number: 10/16/2022, 4:11 PM  Clinical Narrative:                 Spoke with patient at bedside  regarding SDOH including housing, utilities, transportation, and food. Patient stated he was not interested in shelters. "I would rather sleep on the street." Patient stated his current ID is for the [facility] area and that prevents him from getting into the shelters locally. Patient stated he would need handicap transportation. He was advised resources for transportation had been added to AVS. He was advised to follow up with [facility]. Patient stated his current ID is for the [facility] area. Resources for housing, transportation, food and utilities have been added to the AVS.         Patient Goals and CMS Choice            Expected Discharge Plan and Services                                              Prior Living Arrangements/Services                       Activities of Daily Living Home Assistive Devices/Equipment: Cane (specify quad or straight) ADL Screening (condition at time of admission) Patient's cognitive ability adequate to safely complete daily activities?: Yes Is the patient deaf or have difficulty hearing?: No Does the patient have difficulty seeing, even when wearing glasses/contacts?: No Does the patient have difficulty concentrating, remembering, or making decisions?: No Patient able to express need for assistance with ADLs?: Yes Does the patient have difficulty dressing or bathing?: No Independently performs ADLs?: Yes (appropriate for developmental age) Does the patient have difficulty walking or climbing stairs?: Yes Weakness of Legs: None Weakness of Arms/Hands: None  Permission Sought/Granted                   Emotional Assessment              Admission diagnosis:  Cellulitis [L03.90] Cellulitis of lower extremity, unspecified laterality [L03.119] Acute sepsis (HCC) [A41.9] Patient Active Problem List   Diagnosis Date Noted   SOB (shortness of breath) 10/16/2022   Acute HFrEF (heart failure with reduced ejection fraction) (HCC) 10/15/2022   History of alcohol use disorder 10/14/2022   Bilateral lower leg cellulitis 10/14/2022   Chronic bronchitis (HCC) 10/14/2022   Acute sepsis (HCC) 10/14/2022   History of atrial flutter 03/29/2021   Tobacco abuse    Alcohol abuse    Abnormal LFTs    Normocytic anemia    Acute respiratory failure with hypoxia (HCC) 03/22/2021   Leukocytosis 03/22/2021   Atrial flutter (HCC) 03/22/2021   SIRS (systemic inflammatory response syndrome) (HCC) 03/22/2021   Acute bronchitis    Chronic bilateral low back pain without sciatica 10/05/2020   Depression, prolonged 10/05/2020   Elevated blood-pressure reading without diagnosis of hypertension 10/05/2020   PCP:  Mick Sell, MD Pharmacy:   [phone] - [place], [place] - [address] [place] [address] Phone: [phone] Fax: [phone]  [facility]  [facility] [address] [place] [address] Phone: [phone] Fax: [phone]     Social Determinants of Health (SDOH) Social History: SDOH Screenings   Food Insecurity: Food Insecurity Present (10/14/2022)  Housing: High Risk (10/14/2022)  Transportation Needs: Unmet Transportation Needs (10/14/2022)  Utilities: At Risk (10/14/2022)  Tobacco Use: High Risk (10/14/2022)   SDOH Interventions:     Readmission Risk Interventions     No data to display

## 2022-10-16 NOTE — Consult Note (Signed)
Pharmacy Consult Note - Anticoagulation  Pharmacy Consult for heparin Indication: atrial fibrillation  PATIENT MEASUREMENTS: Height: 5\' 8"  (172.7 cm) Weight: 79 kg (174 lb 2.6 oz) IBW/kg (Calculated) : 68.4 HEPARIN DW (KG): 79  VITAL SIGNS: Temp: 98.5 F (36.9 C) (09/18 0043) Temp Source: Oral (09/17 1954) BP: 95/65 (09/18 0043) Pulse Rate: 88 (09/18 0043)  Recent Labs    10/14/22 0243 10/14/22 1610 10/15/22 0012 10/15/22 0536 10/15/22 0928 10/15/22 2253  HGB 15.0  --   --  13.0  --   --   HCT 44.9  --   --  38.7*  --   --   PLT 245  --   --  215  --   --   APTT 28  --   --   --   --   --   LABPROT 14.4 15.4*  --   --   --   --   INR 1.1 1.2  --   --   --   --   HEPARINUNFRC  --   --    < >  --    < > 0.49  CREATININE  --   --   --  0.94  --   --   TROPONINIHS  --  14  --   --   --   --    < > = values in this interval not displayed.    Estimated Creatinine Clearance: 73.8 mL/min (by C-G formula based on SCr of 0.94 mg/dL).  PAST MEDICAL HISTORY: Past Medical History:  Diagnosis Date   Alcohol abuse    Depression    Tobacco abuse     ASSESSMENT: 67 y.o. male with PMH homelessness, EtOH misuse, MDD/GAD, chronic pain, HTN, Aflutter (previously on Xarelto), COPD, HFpEF  is presenting with atrial fibrillation. HR currently in the 120s. While patient has previously been on rivaroxaban for Aflutter, he is not on chronic anticoagulation per chart review. Last fill date was in 02/2021. Pharmacy has been consulted to initiate and manage heparin intravenous infusion.  Pertinent medications: Enoxaparin 40 mg [place] x 1 on 9/16 No chronic AC PTA per chart review Previously on rivaroxaban and amiodarone for Aflutter  Goal(s) of therapy: Heparin level 0.3 - 0.7 units/mL Monitor platelets by anticoagulation protocol: Yes   Baseline anticoagulation labs: Recent Labs    10/14/22 0227 10/14/22 0243 10/14/22 0629 10/15/22 0536  APTT  --  28  --   --   INR  --  1.1 1.2  --    HGB 15.2 15.0  --  13.0  PLT 263 245  --  215    Date Time aPTT/HL Rate/Comment 9/17 0012 HL 0.23 Subtherapeutic 9/17 0928 HL 0.20 Subtherapeutic.  9/17 1625 HL 0.45 Therapeutic x 1 @ 1450 units/hr 9/17 2253 HL 0.49 Therapeutic  x 2     PLAN: Heparin level is therapeutic. Will continue the heparin infusion at 1450 units/hr. Recheck heparin level daily w/ AM labs while therapeutic. CBC daily while on heparin.   Otelia Sergeant, PharmD, [facility] 10/16/2022 1:03 AM

## 2022-10-16 NOTE — Progress Notes (Signed)
[facility] HF Stewardship  PCP: Jack Sell, MD  PCP-Cardiologist: None  HPI: Jack Barber is a 67 y.o. male with Homelessness, depression, alcohol use disorder, chronic pain, HTN, A-flutter previously on amiodarone and Xarelto, COPD who presented with pain, swelling, and redness of bilateral lower extremities. On admission, temperature noted to be 100.2 and WBC 15K. Started on vancomycin and ceftriaxone. EKG showed atrial flutter with RBBB. Troponin negative on admission. TTE showed LVEF of 30-35%, normal RV, severe TR, moderate MR, and aortic sclerosis without stenosis. BNP mildly elevated at 228.8 this AM after 2 days of IV Lasix.  Pertinent cardiac history: Diagnosed with atrial flutter in 2023 and converted to sinus rhythm with amiodarone. LVEF at that time showed normal LVEF. Was planning EP evaluation for ablation, but was lost to follow-up.  Pertinent Lab Values: Creatinine, Ser  Date Value Ref Range Status  10/16/2022 1.04 0.61 - 1.24 mg/dL Final   BUN  Date Value Ref Range Status  10/16/2022 18 8 - 23 mg/dL Final   Potassium  Date Value Ref Range Status  10/16/2022 4.0 3.5 - 5.1 mmol/L Final   Sodium  Date Value Ref Range Status  10/16/2022 138 135 - 145 mmol/L Final   B Natriuretic Peptide  Date Value Ref Range Status  10/15/2022 228.8 (H) 0.0 - 100.0 pg/mL Final    Comment:    Performed at [facility], [address] [place], [place] [address]   Magnesium  Date Value Ref Range Status  10/16/2022 2.1 1.7 - 2.4 mg/dL Final    Comment:    Performed at [facility], [address] [place], [place] [address]   Hgb A1c MFr Bld  Date Value Ref Range Status  03/23/2021 5.2 4.8 - 5.6 % Final    Comment:    (NOTE) Pre diabetes:          5.7%-6.4%  Diabetes:              >6.4%  Glycemic control for   <7.0% adults with diabetes    TSH  Date Value Ref Range Status  10/14/2022 0.868 0.350 - 4.500 uIU/mL Final    Comment:     Performed by a 3rd Generation assay with a functional sensitivity of <=0.01 uIU/mL. Performed at [facility], [address] [place], [place] [address]     Vital Signs: Temp:  [97.7 F (36.5 C)-98.7 F (37.1 C)] 97.8 F (36.6 C) (09/18 0401) Pulse Rate:  [57-111] 88 (09/18 0401) Cardiac Rhythm: Atrial flutter;Bundle branch block (09/17 2033) Resp:  [16-18] 16 (09/18 0401) BP: (95-150)/(65-138) 123/74 (09/18 0401) SpO2:  [96 %-98 %] 96 % (09/18 0401) Weight:  [82.3 kg (181 lb 7 oz)] 82.3 kg (181 lb 7 oz) (09/18 0402)   Intake/Output Summary (Last 24 hours) at 10/16/2022 0749 Last data filed at 10/16/2022 2130 Gross per 24 hour  Intake 1963.46 ml  Output 3625 ml  Net -1661.54 ml    Current Inpatient HF Medications:  -Furosemide 40 mg IV BID -Meotprolol tartrate 12.5 mg BID  Prior to admission HF Medications:  -None  Assessment: 1. Acute heart failure (LVEF 30-35%), due to presumed NICM. NYHA class III-IV symptoms.  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 Remains tachycardic in atrial flutter. Being treated with amiodarone gtt and cardiology planning TEE guided DCCV when euvolemic. -BP improved today with several high readings. Financial situation makes GDMT addition difficult and patient reports difficulty with transportation to follow up appointments. BP stable for spironolactone  and may help decrease daily K supplementation. Potassium is often expensive with insurance and this may save on costs and pill burden. Can consider if BP remains stable today. -Good urine output on furosemide 40 mg IV BID, now 2L net negative. Weight charted as up 7 lbs from admit, but doubt accuracy. Improvement to shortness of breath. Remains anxious about discharge living situation. -Started on low dose metoprolol for rate control. Will need to convert to metoprolol succinate prior to discharge.  Plan: 1) Medication changes recommended at this time: -None at this  time  2) Patient assistance: -Copays for [name], and Jardiance are $47.  -Patient has housing instability and no transportation -Unfortunately does not meet income cutoff for Medicare extra help, but may qualify for manufacturer's assistance. -Would benefit from transportation assistance for clinic visits.  3) Education: - Patient has been educated on current HF medications and potential additions to HF medication regimen - Patient verbalizes understanding that over the next few months, these medication doses may change and more medications may be added to optimize HF regimen - Patient has been educated on basic disease state pathophysiology and goals of therapy  Medication Assistance / Insurance Benefits Check:  Does the patient have prescription insurance? Prescription Insurance: MedicareMedicare  Type of insurance plan:   Does the patient qualify for medication assistance through manufacturers or grants? Yes   Eligible grants and/or patient assistance programs: Valla Leaver   Medication assistance applications in progress: none   Medication assistance applications approved: none  Approved medication assistance renewals will be completed by: pending  Outpatient Pharmacy:  Prior to admission outpatient pharmacy: none   Is the patient willing to utilize a [place] pharmacy at discharge?: Yes Thank you for involving pharmacy in this patient's care.  Enos Fling, PharmD, BCPS Phone - [phone] Clinical Pharmacist 10/16/2022 7:49 AM

## 2022-10-16 NOTE — Consult Note (Signed)
Pharmacy Consult Note - Anticoagulation  Pharmacy Consult for heparin Indication: atrial fibrillation  PATIENT MEASUREMENTS: Height: 5\' 8"  (172.7 cm) Weight: 82.3 kg (181 lb 7 oz) IBW/kg (Calculated) : 68.4 HEPARIN DW (KG): 79  VITAL SIGNS: Temp: 97.8 F (36.6 C) (09/18 0401) Temp Source: Oral (09/18 0401) BP: 123/74 (09/18 0401) Pulse Rate: 88 (09/18 0401)  Recent Labs    10/14/22 0243 10/14/22 0629 10/15/22 0012 10/16/22 0426  HGB 15.0  --    < > 13.2  HCT 44.9  --    < > 39.6  PLT 245  --    < > 214  APTT 28  --   --   --   LABPROT 14.4 15.4*  --   --   INR 1.1 1.2  --   --   HEPARINUNFRC  --   --    < > 0.39  CREATININE  --   --    < > 1.04  TROPONINIHS  --  14  --   --    < > = values in this interval not displayed.    Estimated Creatinine Clearance: 72.1 mL/min (by C-G formula based on SCr of 1.04 mg/dL).  PAST MEDICAL HISTORY: Past Medical History:  Diagnosis Date   Alcohol abuse    Depression    Tobacco abuse     ASSESSMENT: 67 y.o. male with PMH homelessness, EtOH misuse, MDD/GAD, chronic pain, HTN, Aflutter (previously on Xarelto), COPD, HFpEF  is presenting with atrial fibrillation. HR currently in the 120s. While patient has previously been on rivaroxaban for Aflutter, he is not on chronic anticoagulation per chart review. Last fill date was in 02/2021. Pharmacy has been consulted to initiate and manage heparin intravenous infusion.  Pertinent medications: Enoxaparin 40 mg [place] x 1 on 9/16 No chronic AC PTA per chart review Previously on rivaroxaban and amiodarone for Aflutter  Goal(s) of therapy: Heparin level 0.3 - 0.7 units/mL Monitor platelets by anticoagulation protocol: Yes   Baseline anticoagulation labs: Recent Labs    10/14/22 0243 10/14/22 0629 10/15/22 0536 10/16/22 0426  APTT 28  --   --   --   INR 1.1 1.2  --   --   HGB 15.0  --  13.0 13.2  PLT 245  --  215 214    Date Time aPTT/HL Rate/Comment 9/17 0012 HL  0.23 Subtherapeutic 9/17 0928 HL 0.20 Subtherapeutic.  9/17 1625 HL 0.45 Therapeutic x 1 @ 1450 units/hr 9/17 2253 HL 0.49 Therapeutic  x 2 9/18 0426 HL 0.39 Therapeutic x 3     PLAN: Heparin level is therapeutic. Will continue the heparin infusion at 1450 units/hr. Recheck heparin level daily w/ AM labs while therapeutic. CBC daily while on heparin.   Otelia Sergeant, PharmD, [facility] 10/16/2022 5:28 AM

## 2022-10-17 ENCOUNTER — Other Ambulatory Visit: Payer: Self-pay

## 2022-10-17 ENCOUNTER — Inpatient Hospital Stay: Payer: Medicare Other | Admitting: Anesthesiology

## 2022-10-17 ENCOUNTER — Inpatient Hospital Stay (HOSPITAL_COMMUNITY)
Admit: 2022-10-17 | Discharge: 2022-10-17 | Disposition: A | Discharge status: Home / Self Care | Payer: Medicare Other | Attending: Medical | Admitting: Medical

## 2022-10-17 ENCOUNTER — Encounter: Payer: Self-pay | Admitting: Internal Medicine

## 2022-10-17 ENCOUNTER — Encounter
Admission: EM | Disposition: A | Discharge status: Home / Self Care | Payer: Self-pay | Source: Home / Self Care | Attending: Internal Medicine

## 2022-10-17 DIAGNOSIS — I361 Nonrheumatic tricuspid (valve) insufficiency: Secondary | ICD-10-CM | POA: Diagnosis not present

## 2022-10-17 DIAGNOSIS — L03116 Cellulitis of left lower limb: Secondary | ICD-10-CM | POA: Diagnosis not present

## 2022-10-17 DIAGNOSIS — I4892 Unspecified atrial flutter: Secondary | ICD-10-CM | POA: Diagnosis not present

## 2022-10-17 DIAGNOSIS — I340 Nonrheumatic mitral (valve) insufficiency: Secondary | ICD-10-CM | POA: Diagnosis not present

## 2022-10-17 DIAGNOSIS — L03119 Cellulitis of unspecified part of limb: Secondary | ICD-10-CM

## 2022-10-17 DIAGNOSIS — J42 Unspecified chronic bronchitis: Secondary | ICD-10-CM | POA: Diagnosis not present

## 2022-10-17 DIAGNOSIS — I483 Typical atrial flutter: Secondary | ICD-10-CM | POA: Diagnosis not present

## 2022-10-17 DIAGNOSIS — Z8679 Personal history of other diseases of the circulatory system: Secondary | ICD-10-CM | POA: Diagnosis not present

## 2022-10-17 DIAGNOSIS — A419 Sepsis, unspecified organism: Secondary | ICD-10-CM | POA: Diagnosis not present

## 2022-10-17 DIAGNOSIS — I5021 Acute systolic (congestive) heart failure: Secondary | ICD-10-CM | POA: Diagnosis not present

## 2022-10-17 HISTORY — PX: TEE WITHOUT CARDIOVERSION: SHX5443

## 2022-10-17 HISTORY — PX: CARDIOVERSION: SHX1299

## 2022-10-17 LAB — BASIC METABOLIC PANEL
Anion gap: 8 (ref 5–15)
BUN: 21 mg/dL (ref 8–23)
CO2: 26 mmol/L (ref 22–32)
Calcium: 9.2 mg/dL (ref 8.9–10.3)
Chloride: 105 mmol/L (ref 98–111)
Creatinine, Ser: 0.98 mg/dL (ref 0.61–1.24)
GFR, Estimated: >60 mL/min (ref >60)
Glucose, Bld: 108 mg/dL — ABNORMAL HIGH (ref 70–99)
Potassium: 3.9 mmol/L (ref 3.5–5.1)
Sodium: 139 mmol/L (ref 135–145)

## 2022-10-17 LAB — CBC
HCT: 40.1 % (ref 39.0–52.0)
Hemoglobin: 13.6 g/dL (ref 13.0–17.0)
MCH: 32.5 pg (ref 26.0–34.0)
MCHC: 33.9 g/dL (ref 30.0–36.0)
MCV: 95.7 fL (ref 80.0–100.0)
Platelets: 202 10*3/uL (ref 150–400)
RBC: 4.19 MIL/uL — ABNORMAL LOW (ref 4.22–5.81)
RDW: 13.9 % (ref 11.5–15.5)
WBC: 9.1 10*3/uL (ref 4.0–10.5)
nRBC: 0 % (ref 0.0–0.2)

## 2022-10-17 LAB — HEPARIN LEVEL (UNFRACTIONATED): Heparin Unfractionated: 0.28 IU/mL — ABNORMAL LOW (ref 0.30–0.70)

## 2022-10-17 LAB — ECHO TEE

## 2022-10-17 LAB — PHOSPHORUS: Phosphorus: 4.4 mg/dL (ref 2.5–4.6)

## 2022-10-17 LAB — MAGNESIUM: Magnesium: 2.2 mg/dL (ref 1.7–2.4)

## 2022-10-17 SURGERY — ECHOCARDIOGRAM, TRANSESOPHAGEAL
Anesthesia: General

## 2022-10-17 MED ORDER — SODIUM CHLORIDE 0.9 % IV SOLN: INTRAVENOUS | Status: DC

## 2022-10-17 MED ORDER — PROPOFOL 10 MG/ML IV BOLUS
INTRAVENOUS | Status: DC | PRN
  Administered 2022-10-17: 50 mg via INTRAVENOUS
  Administered 2022-10-17: 70 mg via INTRAVENOUS

## 2022-10-17 MED ORDER — PROPOFOL 1000 MG/100ML IV EMUL
INTRAVENOUS | Status: AC
  Filled 2022-10-17: qty 100

## 2022-10-17 MED ORDER — BUTAMBEN-TETRACAINE-BENZOCAINE 2-2-14 % EX AERO
INHALATION_SPRAY | CUTANEOUS | Status: AC
  Filled 2022-10-17: qty 5

## 2022-10-17 MED ORDER — PHENYLEPHRINE 80 MCG/ML (10ML) SYRINGE FOR IV PUSH (FOR BLOOD PRESSURE SUPPORT)
PREFILLED_SYRINGE | INTRAVENOUS | Status: DC | PRN
  Administered 2022-10-17: 80 ug via INTRAVENOUS

## 2022-10-17 MED ORDER — LIDOCAINE VISCOUS HCL 2 % MT SOLN
OROMUCOSAL | Status: AC
  Filled 2022-10-17: qty 15

## 2022-10-17 MED ORDER — EPHEDRINE SULFATE (PRESSORS) 50 MG/ML IJ SOLN
INTRAMUSCULAR | Status: DC | PRN
  Administered 2022-10-17 (×2): 10 mg via INTRAVENOUS

## 2022-10-17 MED ORDER — HEPARIN BOLUS VIA INFUSION
1200.0000 [IU] | Freq: Once | INTRAVENOUS | Status: AC
  Administered 2022-10-17: 1200 [IU] via INTRAVENOUS
  Filled 2022-10-17: qty 1200

## 2022-10-17 MED ORDER — PHENYLEPHRINE HCL (PRESSORS) 10 MG/ML IV SOLN
INTRAVENOUS | Status: DC | PRN
  Administered 2022-10-17: 160 ug via INTRAVENOUS

## 2022-10-17 MED ORDER — APIXABAN 5 MG PO TABS
5.0000 mg | ORAL_TABLET | Freq: Two times a day (BID) | ORAL | Status: DC
  Administered 2022-10-17 – 2022-10-18 (×2): 5 mg via ORAL
  Filled 2022-10-17 (×2): qty 1

## 2022-10-17 MED ORDER — AMIODARONE HCL 200 MG PO TABS
400.0000 mg | ORAL_TABLET | Freq: Two times a day (BID) | ORAL | Status: DC
  Administered 2022-10-17 – 2022-10-18 (×3): 400 mg via ORAL
  Filled 2022-10-17 (×3): qty 2

## 2022-10-17 MED ORDER — HEPARIN (PORCINE) 25000 UT/250ML-% IV SOLN
1600.0000 [IU]/h | INTRAVENOUS | Status: AC
  Administered 2022-10-17 (×2): 1600 [IU]/h via INTRAVENOUS
  Filled 2022-10-17: qty 250

## 2022-10-17 MED ORDER — LIDOCAINE HCL (CARDIAC) PF 50 MG/5ML IV SOSY
PREFILLED_SYRINGE | INTRAVENOUS | Status: DC | PRN
Start: 2022-10-17 — End: 2022-10-17
  Administered 2022-10-17: 60 mg via INTRAVENOUS

## 2022-10-17 NOTE — Progress Notes (Signed)
[facility] HF Stewardship  PCP: Mick Sell, MD  PCP-Cardiologist: None  HPI: Jack Barber is a 67 y.o. male with Homelessness, depression, alcohol use disorder, chronic pain, HTN, A-flutter previously on amiodarone and Xarelto, COPD who presented with pain, swelling, and redness of bilateral lower extremities. On admission, temperature noted to be 100.2 and WBC 15K. Started on vancomycin and ceftriaxone. EKG showed atrial flutter with RBBB. Troponin negative on admission. TTE showed LVEF of 30-35%, normal RV, severe TR, moderate MR, and aortic sclerosis without stenosis. BNP mildly elevated at 228.8 this AM after 2 days of IV Lasix. Cardiology planning DCCV today.   Pertinent cardiac history: Diagnosed with atrial flutter in 2023 and converted to sinus rhythm with amiodarone. LVEF at that time showed normal LVEF. Was planning EP evaluation for ablation, but was lost to follow-up.  Pertinent Lab Values: Creatinine, Ser  Date Value Ref Range Status  10/17/2022 0.98 0.61 - 1.24 mg/dL Final   BUN  Date Value Ref Range Status  10/17/2022 21 8 - 23 mg/dL Final   Potassium  Date Value Ref Range Status  10/17/2022 3.9 3.5 - 5.1 mmol/L Final   Sodium  Date Value Ref Range Status  10/17/2022 139 135 - 145 mmol/L Final   B Natriuretic Peptide  Date Value Ref Range Status  10/15/2022 228.8 (H) 0.0 - 100.0 pg/mL Final    Comment:    Performed at [facility], [address] [place], [place] [address]   Magnesium  Date Value Ref Range Status  10/17/2022 2.2 1.7 - 2.4 mg/dL Final    Comment:    Performed at [facility], [address] [place], [place] [address]   Hgb A1c MFr Bld  Date Value Ref Range Status  03/23/2021 5.2 4.8 - 5.6 % Final    Comment:    (NOTE) Pre diabetes:          5.7%-6.4%  Diabetes:              >6.4%  Glycemic control for   <7.0% adults with diabetes    TSH  Date Value Ref Range Status  10/14/2022 0.868 0.350 - 4.500  uIU/mL Final    Comment:    Performed by a 3rd Generation assay with a functional sensitivity of <=0.01 uIU/mL. Performed at [facility], [address] [place], [place] [address]     Vital Signs: Temp:  [97.5 F (36.4 C)-98.2 F (36.8 C)] 97.9 F (36.6 C) (09/19 0731) Pulse Rate:  [66-87] 76 (09/19 0731) Cardiac Rhythm: Atrial fibrillation (09/18 2151) Resp:  [18-27] 20 (09/19 0731) BP: (97-116)/(62-97) 105/86 (09/19 0731) SpO2:  [95 %-98 %] 97 % (09/19 0731)   Intake/Output Summary (Last 24 hours) at 10/17/2022 0753 Last data filed at 10/17/2022 0710 Gross per 24 hour  Intake 1741.49 ml  Output 2425 ml  Net -683.51 ml    Current Inpatient HF Medications:  -Furosemide 40 mg IV BID -Meotprolol tartrate 12.5 mg BID  Prior to admission HF Medications:  -None  Assessment: 1. Acute heart failure (LVEF 30-35%), due to presumed NICM. NYHA class III-IV symptoms.  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 Rate controlled in atrial flutter. Being treated with amiodarone gtt and cardiology planning DCCV today. -BP improved today. Financial situation makes GDMT addition difficult and patient reports difficulty with transportation to follow up appointments. BP stable for spironolactone and may help decrease daily K supplementation. Potassium is often expensive with insurance and this may save on costs and  pill burden. Can consider if BP remains stable after DVVC. Follow up labs, however, would be a concern. -Good urine output on furosemide 40 mg IV BID, now 2L net negative. Urine is still clear. Weight charted as up 7 lbs from admit, but doubt accuracy. Improvement to shortness of breath. Remains anxious about discharge living situation. -Started on low dose metoprolol for rate control. Will need to convert to metoprolol succinate prior to discharge.  Plan: 1) Medication changes recommended at this time: -None at this time.   2) Patient  assistance: -Copays for [name], and Jardiance are $47.  -Patient has housing instability and no transportation -Unfortunately does not meet income cutoff for Medicare extra help, but may qualify for manufacturer's assistance. -Would benefit from transportation assistance for clinic visits.  3) Education: - Patient has been educated on current HF medications and potential additions to HF medication regimen - Patient verbalizes understanding that over the next few months, these medication doses may change and more medications may be added to optimize HF regimen - Patient has been educated on basic disease state pathophysiology and goals of therapy  Medication Assistance / Insurance Benefits Check:  Does the patient have prescription insurance? Prescription Insurance: Medicare   Type of insurance plan:   Does the patient qualify for medication assistance through manufacturers or grants? Yes   Eligible grants and/or patient assistance programs: Valla Leaver   Medication assistance applications in progress: none   Medication assistance applications approved: none  Approved medication assistance renewals will be completed by: pending  Outpatient Pharmacy:  Prior to admission outpatient pharmacy: none   Is the patient willing to utilize a  pharmacy at discharge?: Yes Thank you for involving pharmacy in this patient's care.  Enos Fling, PharmD, BCPS Phone - [phone] Clinical Pharmacist 10/17/2022 7:53 AM

## 2022-10-17 NOTE — Progress Notes (Signed)
Transesophageal Echocardiogram :  Indication: Atrial flutter, typical Requesting/ordering  physician: Dr.  Clide Dales  Procedure: Benzocaine spray x2 and 2 mls x 2 of viscous lidocaine were given orally to provide local anesthesia to the oropharynx. The patient was positioned supine on the left side, bite block provided. The patient was moderately sedated with the doses of versed and fentanyl as detailed below.  Using digital technique an omniplane probe was advanced into the distal esophagus without incident.   Moderate sedation: 1. Sedation used: Per anesthesia team  See report in EPIC  for complete details: In brief, transgastric imaging revealed moderately depressed LV function with no RWMAs and no mural apical thrombus.   Estimated ejection fraction was 30 to 35% %.  Right sided cardiac chambers  with no evidence of pulmonary hypertension, mildly depressed RV function.  Imaging of the septum showed no ASD or VSD Bubble study was negative for shunt 2D and color flow confirmed no PFO  The LA was well visualized in orthogonal views.  There was no spontaneous contrast and no thrombus in the LA and LA appendage   The descending thoracic aorta had no  mural aortic debris with no evidence of aneurysmal dilation or dissection, Mild aortic atherosclerosis  Cardioversion to follow   Julien Nordmann 10/17/2022 2:14 PM

## 2022-10-17 NOTE — CV Procedure (Signed)
Cardioversion procedure note For atrial flutter, typical  Procedure Details:  Consent: Risks of procedure as well as the alternatives and risks of each were explained to the (patient/caregiver).  Consent for procedure obtained.  Time Out: Verified patient identification, verified procedure, site/side was marked, verified correct patient position, special equipment/implants available, medications/allergies/relevent history reviewed, required imaging and test results available.  Performed  Patient placed on cardiac monitor, pulse oximetry, supplemental oxygen as necessary.   Sedation given: propofol IV, Dr. Laural Benes Pacer pads placed anterior and posterior chest.   Cardioverted 1 time(s).   Cardioverted at  120 J. Synchronized biphasic Converted to NSR   Evaluation: Findings: Post procedure EKG shows: NSR Complications: None Patient did tolerate procedure well.  Time Spent Directly with the Patient:  45 minutes   Jack Barber, M.D., Ph.D.

## 2022-10-17 NOTE — Progress Notes (Signed)
Progress Note   Patient: Jack Barber DOB: [birth year] DOA: 10/14/2022     3 DOS: the patient was seen and examined on 10/17/2022   Brief hospital course: Jack Barber is a 67 y.o. male with medical history significant for Homelessness, depression, alcohol use disorder, chronic pain, HTN, A-flutter previously on amiodarone and Xarelto, COPD, who presents to the ED with pain swelling and redness bilateral lower extremities.  He denies fever or chills has no shortness of breath.  He states his legs have always been swelling but over the last few days he was on his feet a lot and they became very painful. He was noted to be tachycardic with low grade temp admitted to hospitalist service for IV antibiotic therapy for leg cellulitis  Assessment and Plan: * Bilateral lower leg cellulitis Sepsis- ruled out. Tachycardic with Aflutter. Possible CHF causing leg swelling. Swelling and redness improved. DVT study negative. Continue Cefadroxil to finish 5 day antibiotic therapy, Keep legs elevated.   Atrial Flutter with rapid ventricular response- He was on amio/ xarelto but did not follow up. Started oral Lopressor therapy. Cardiology evaluation appreciated.  He had TEE cardioversion today, he converted to NSR.  Heparin drip to be transitioned to Eliquis.  Amio transition to oral 400mg  twice daily.  Acute on chronic systolic CHF- EF 16-60%. BNP high. Continue IV lasix 40mg  bid. Daily weights, strict input and output. Cardiology advised outpatient ischemic workup if f/u echo with low EF.  Chronic bronchitis (HCC) Not acutely exacerbated DuoNebs as needed. Smoking cessation advised.  History of alcohol use disorder Tobacco abuse States he no longer drinks. Advised smoking cessation.      Subjective: Patient is seen and examined today morning. He is lying comfortably. Waiting for TEE. Says he had rough night. Eating fair, denies any complaints.  Physical  Exam: Vitals:   10/17/22 1330 10/17/22 1345 10/17/22 1400 10/17/22 1415  BP: 105/62 100/72 98/72 106/74  Pulse: (!) 56 66 66 68  Resp: (!) 22 (!) 21 (!) 25 (!) 22  Temp:      TempSrc:      SpO2: 99% 94% 93% 92%  Weight:      Height:       General - Elderly Caucasian weak male, no apparent distress HEENT - PERRLA, EOMI, atraumatic head, non tender sinuses. Lung - Clear, diffuse rales Heart - S1, S2 heard, no murmurs, rubs, 1+ pedal edema Neuro - Alert, awake and oriented x 3, non focal exam. Skin - Warm and dry. Leg swelling and redness improved. Data Reviewed:     Latest Ref Rng & Units 10/17/2022    3:51 AM 10/16/2022    4:26 AM 10/15/2022    5:36 AM  CBC  WBC 4.0 - 10.5 K/uL 9.1  10.4  10.4   Hemoglobin 13.0 - 17.0 g/dL 63.0  16.0  10.9   Hematocrit 39.0 - 52.0 % 40.1  39.6  38.7   Platelets 150 - 400 K/uL 202  214  215        Latest Ref Rng & Units 10/17/2022    3:51 AM 10/16/2022    4:26 AM 10/15/2022    5:36 AM  BMP  Glucose 70 - 99 mg/dL 323  557  322   BUN 8 - 23 mg/dL 21  18  19    Creatinine 0.61 - 1.24 mg/dL 0.25  4.27  0.62   Sodium 135 - 145 mmol/L 139  138  137   Potassium 3.5 - 5.1  mmol/L 3.9  4.0  4.1   Chloride 98 - 111 mmol/L 105  105  106   CO2 22 - 32 mmol/L 26  24  25    Calcium 8.9 - 10.3 mg/dL 9.2  8.9  8.8    No results found.  Family Communication: homeless, he understands current care plan.  Disposition: Status is: Inpatient Remains inpatient appropriate because: cardiac work up, transition of IV meds.  Planned Discharge Destination: Barriers to discharge: homeless    Time spent: 42 minutes  Author: Marcelino Duster, MD 10/17/2022 3:46 PM  For on call review [email or web address].

## 2022-10-17 NOTE — Anesthesia Preprocedure Evaluation (Addendum)
Anesthesia Evaluation  Patient identified by MRN, date of birth, ID band Patient awake    Reviewed: Allergy & Precautions, H&P , NPO status , Patient's Chart, lab work & pertinent test results  Airway Mallampati: II  TM Distance: >3 FB Neck ROM: full    Dental no notable dental hx.    Pulmonary COPD,  COPD inhaler, Current Smoker and Patient abstained from smoking.   Pulmonary exam normal        Cardiovascular Exercise Tolerance: Poor + angina with exertion +CHF  + dysrhythmias Atrial Fibrillation  Rhythm:Irregular Rate:Normal   Acute on chronic systolic CHF    ECHO  1. Left ventricular ejection fraction, by estimation, is 30 to 35%. The  left ventricle has moderately decreased function. Left ventricular  endocardial border not optimally defined to evaluate regional wall motion.  There is mild left ventricular  hypertrophy. Left ventricular diastolic parameters are indeterminate.   2. Right ventricular systolic function is normal. The right ventricular  size is normal. There is normal pulmonary artery systolic pressure.   3. Left atrial size was mildly dilated.   4. Right atrial size was mildly dilated.   5. The mitral valve is normal in structure. Moderate mitral valve  regurgitation. No evidence of mitral stenosis.   6. Tricuspid valve regurgitation is severe.   7. The aortic valve is calcified. Aortic valve regurgitation is not  visualized. Aortic valve sclerosis/calcification is present, without any  evidence of aortic stenosis.   8. The inferior vena cava is normal in size with <50% respiratory  variability, suggesting right atrial pressure of 8 mmHg.     Neuro/Psych  PSYCHIATRIC DISORDERS  Depression    negative neurological ROS     GI/Hepatic negative GI ROS, Neg liver ROS,,,  Endo/Other  negative endocrine ROS    Renal/GU negative Renal ROS  negative genitourinary   Musculoskeletal   Abdominal Normal  abdominal exam  (+)   Peds  Hematology negative hematology ROS (+)   Anesthesia Other Findings Bilateral lower leg cellulitis  Past Medical History: No date: Alcohol abuse No date: Depression No date: Tobacco abuse  History reviewed. No pertinent surgical history.  BMI    Body Mass Index: 27.59 kg/m      Reproductive/Obstetrics negative OB ROS                             Anesthesia Physical Anesthesia Plan  ASA: 3  Anesthesia Plan: General   Post-op Pain Management:    Induction:   PONV Risk Score and Plan: Propofol infusion and TIVA  Airway Management Planned: Natural Airway  Additional Equipment:   Intra-op Plan:   Post-operative Plan:   Informed Consent: I have reviewed the patients History and Physical, chart, labs and discussed the procedure including the risks, benefits and alternatives for the proposed anesthesia with the patient or authorized representative who has indicated his/her understanding and acceptance.     Dental Advisory Given  Plan Discussed with: CRNA and Surgeon  Anesthesia Plan Comments:         Anesthesia Quick Evaluation

## 2022-10-17 NOTE — Anesthesia Postprocedure Evaluation (Signed)
Anesthesia Post Note  Patient: Jack Barber  Procedure(s) Performed: TRANSESOPHAGEAL ECHOCARDIOGRAM CARDIOVERSION  Patient location during evaluation: PACU Anesthesia Type: General Level of consciousness: awake and alert Pain management: pain level controlled Vital Signs Assessment: post-procedure vital signs reviewed and stable Respiratory status: spontaneous breathing, nonlabored ventilation and respiratory function stable Cardiovascular status: blood pressure returned to baseline and stable Postop Assessment: no apparent nausea or vomiting Anesthetic complications: no   No notable events documented.   Last Vitals:  Vitals:   10/17/22 1304 10/17/22 1305  BP:    Pulse: 82 86  Resp: (!) 27 (!) 21  Temp:    SpO2: 100% (!) 77%    Last Pain:  Vitals:   10/17/22 1205  TempSrc: Oral  PainSc: 0-No pain                 Foye Deer

## 2022-10-17 NOTE — Care Management Important Message (Signed)
Important Message  Patient Details  Name: Jack Barber MRN: [id number] Date of Birth: 1956-01-24   Medicare Important Message Given:  Yes  Patient out of room upon time of visit, no family in room.  Copy of Medicare IM left on bedside tray in room for reference.   Johnell Comings 10/17/2022, 1:55 PM

## 2022-10-17 NOTE — Transfer of Care (Signed)
Immediate Anesthesia Transfer of Care Note  Patient: Jack Barber  Procedure(s) Performed: TRANSESOPHAGEAL ECHOCARDIOGRAM CARDIOVERSION  Patient Location: PACU  Anesthesia Type:General  Level of Consciousness: drowsy  Airway & Oxygen Therapy: Patient Spontanous Breathing and Patient connected to nasal cannula oxygen  Post-op Assessment: Report given to RN and Post -op Vital signs reviewed and stable  Post vital signs: Reviewed and stable  Last Vitals:  Vitals Value Taken Time  BP 94/67 10/17/22 1319  Temp    Pulse 55 10/17/22 1329  Resp 18 10/17/22 1329  SpO2 99 % 10/17/22 1329  Vitals shown include unfiled device data.  Last Pain:  Vitals:   10/17/22 1205  TempSrc: Oral  PainSc: 0-No pain         Complications: No notable events documented.

## 2022-10-17 NOTE — Progress Notes (Signed)
*  PRELIMINARY RESULTS* Echocardiogram Echocardiogram Transesophageal has been performed.  Carolyne Fiscal 10/17/2022, 1:24 PM

## 2022-10-17 NOTE — Progress Notes (Signed)
Pharmacy Consult Note - Anticoagulation  Pharmacy Consult for heparin Indication: atrial fibrillation  PATIENT MEASUREMENTS: Height: 5\' 8"  (172.7 cm) Weight: 82.3 kg (181 lb 7 oz) IBW/kg (Calculated) : 68.4 HEPARIN DW (KG): 79  VITAL SIGNS: Temp: 97.6 F (36.4 C) (09/19 0352) Temp Source: Oral (09/18 2326) BP: 116/85 (09/19 0352) Pulse Rate: 83 (09/19 0352)  Recent Labs    10/14/22 0629 10/15/22 0012 10/17/22 0351  HGB  --    < > 13.6  HCT  --    < > 40.1  PLT  --    < > 202  LABPROT 15.4*  --   --   INR 1.2  --   --   HEPARINUNFRC  --    < > 0.28*  CREATININE  --    < > 0.98  TROPONINIHS 14  --   --    < > = values in this interval not displayed.    Estimated Creatinine Clearance: 76.6 mL/min (by C-G formula based on SCr of 0.98 mg/dL).  PAST MEDICAL HISTORY: Past Medical History:  Diagnosis Date   Alcohol abuse    Depression    Tobacco abuse     ASSESSMENT: 67 y.o. male with PMH homelessness, EtOH misuse, MDD/GAD, chronic pain, HTN, Aflutter (previously on Xarelto), COPD, HFpEF  is presenting with atrial fibrillation. HR currently in the 120s. While patient has previously been on rivaroxaban for Aflutter, he is not on chronic anticoagulation per chart review. Last fill date was in 02/2021. Pharmacy has been consulted to initiate and manage heparin intravenous infusion.  Pertinent medications: Enoxaparin 40 mg Caroleen x 1 on 9/16 No chronic AC PTA per chart review Previously on rivaroxaban and amiodarone for Aflutter  Goal(s) of therapy: Heparin level 0.3 - 0.7 units/mL Monitor platelets by anticoagulation protocol: Yes   Baseline anticoagulation labs: Recent Labs    10/14/22 0629 10/15/22 0536 10/16/22 0426 10/17/22 0351  INR 1.2  --   --   --   HGB  --  13.0 13.2 13.6  PLT  --  215 214 202    Date Time aPTT/HL Rate/Comment 9/17 0012 HL 0.23 Subtherapeutic 9/17 0928 HL 0.20 Subtherapeutic.  9/17 1625 HL 0.45 Therapeutic x 1 @ 1450  units/hr 9/17 2253 HL 0.49 Therapeutic  x 2 9/18 0426 HL 0.39 Therapeutic x 3 9/19 0351 HL 0.28 Subtherapeutic     PLAN: --Bolus 1200 units x 1 --Will increase heparin infusion to 1600 units/hr.  --Recheck HL in 6 hrs after rate change --CBC daily while on heparin.   Otelia Sergeant, PharmD, [facility] 10/17/2022 5:46 AM

## 2022-10-17 NOTE — Progress Notes (Signed)
Rounding Note    Patient Name: Jack Barber Date of Encounter: 10/17/2022  [facility] Cardiologist: [facility]  Subjective   Underwent TEE and cardioversion today, normal sinus rhythm restored Ejection fraction prior to cardioversion was 35% Hypertension during procedure after propofol, improved post procedure   Inpatient Medications    Scheduled Meds:  cefadroxil  500 mg Oral BID   fluticasone furoate-vilanterol  1 puff Inhalation Daily   furosemide  40 mg Intravenous Q12H   metoprolol tartrate  12.5 mg Oral BID   nicotine  21 mg Transdermal Daily   potassium chloride  20 mEq Oral BID   Continuous Infusions:  amiodarone 30 mg/hr (10/17/22 0438)   heparin 1,600 Units/hr (10/17/22 0712)   PRN Meds: acetaminophen **OR** acetaminophen, albuterol, ALPRAZolam, HYDROcodone-acetaminophen, ketorolac, ondansetron **OR** ondansetron (ZOFRAN) IV   Vital Signs    Vitals:   10/17/22 1330 10/17/22 1345 10/17/22 1400 10/17/22 1415  BP: 105/62 100/72 98/72 106/74  Pulse: (!) 56 66 66 68  Resp: (!) 22 (!) 21 (!) 25 (!) 22  Temp:      TempSrc:      SpO2: 99% 94% 93% 92%  Weight:      Height:        Intake/Output Summary (Last 24 hours) at 10/17/2022 1434 Last data filed at 10/17/2022 1345 Gross per 24 hour  Intake 1642.41 ml  Output 2800 ml  Net -1157.59 ml      10/17/2022   12:05 PM 10/16/2022    4:02 AM 10/14/2022    2:07 AM  Last 3 Weights  Weight (lbs) 181 lb 7 oz 181 lb 7 oz 174 lb 2.6 oz  Weight (kg) 82.3 kg 82.3 kg 79 kg      Telemetry    Normal sinus rhythm- Personally Reviewed  ECG     - Personally Reviewed  Physical Exam   GEN: No acute distress.   Neck: No JVD Cardiac: RRR, no murmurs, rubs, or gallops.  Respiratory: Clear to auscultation bilaterally. GI: Soft, nontender, non-distended  MS: No edema; No deformity. Neuro:  Nonfocal  Psych: Normal affect   Labs    High Sensitivity Troponin:   Recent Labs  Lab  10/14/22 0227 10/14/22 0629  TROPONINIHS 17 14     Chemistry Recent Labs  Lab 10/14/22 0243 10/15/22 0536 10/16/22 0426 10/17/22 0351  NA  --  137 138 139  K  --  4.1 4.0 3.9  CL  --  106 105 105  CO2  --  25 24 26   GLUCOSE  --  118* 118* 108*  BUN  --  19 18 21   CREATININE  --  0.94 1.04 0.98  CALCIUM  --  8.8* 8.9 9.2  MG  --  2.1 2.1 2.2  PROT 6.3*  --   --   --   ALBUMIN 4.2  --   --   --   AST 26  --   --   --   ALT 27  --   --   --   ALKPHOS 48  --   --   --   BILITOT 1.1  --   --   --   GFRNONAA  --  >60 >60 >60  ANIONGAP  --  6 9 8     Lipids No results for input(s): "CHOL", "TRIG", "HDL", "LABVLDL", "LDLCALC", "CHOLHDL" in the last 168 hours.  Hematology Recent Labs  Lab 10/15/22 0536 10/16/22 0426 10/17/22 0351  WBC 10.4 10.4 9.1  RBC 4.04* 4.07* 4.19*  HGB 13.0 13.2 13.6  HCT 38.7* 39.6 40.1  MCV 95.8 97.3 95.7  MCH 32.2 32.4 32.5  MCHC 33.6 33.3 33.9  RDW 13.9 13.7 13.9  PLT 215 214 202   Thyroid  Recent Labs  Lab 10/14/22 0411  TSH 0.868    BNP Recent Labs  Lab 10/15/22 0536  BNP 228.8*    DDimer  Recent Labs  Lab 10/14/22 1447  DDIMER <0.27     Radiology    No results found.  Cardiac Studies     Patient Profile     67 y.o. male  with a hx of tobacco use, homelessness, alcohol abuse, atrial flutter who is being seen 10/14/2022 for the evaluation of atrial fibrillation   Assessment & Plan    Atrial flutter Prior history atrial flutter 2023 converting with amiodarone IV Presenting in atrial fibrillation with RVR unclear chronicity -Restarted on IV heparin Improving rate controlled on metoprolol and amiodarone -Underwent TEE and cardioversion this morning, normal sinus rhythm restored -Will transition off heparin infusion onto Eliquis 5 twice daily starting this evening -Transition off amiodarone infusion onto amiodarone 400 twice daily this afternoon -Pharmacy has 30 days of Eliquis free coupon for him to take with  him -He has signed paperwork for Xarelto assistance   Cardiomyopathy Likely in the setting of tachycardia mediated pathology, atrial flutter -Has responded to IV Lasix twice daily -Plan for outpatient limited echocardiogram to reassess ejection fraction and normal sinus rhythm at a later date -If ejection fraction remains low on follow-up echo, consider ischemic workup   Lower extremity cellulitis Following ant bites Has been treated with IV antibiotics DVT study negative -Symptoms resolved   Smoker We have encouraged him to continue to work on weaning his cigarettes and smoking cessation. He will continue to work on this and does not want any assistance with chantix.      Total encounter time more than 50 minutes  Greater than 50% was spent in counseling and coordination of care with the patient   For questions or updates, please contact [facility] Please consult [email or web address] for contact info under        Signed, Julien Nordmann, MD  10/17/2022, 2:34 PM

## 2022-10-18 ENCOUNTER — Encounter: Payer: Self-pay | Admitting: Cardiovascular Disease

## 2022-10-18 ENCOUNTER — Other Ambulatory Visit: Payer: Self-pay

## 2022-10-18 DIAGNOSIS — I5021 Acute systolic (congestive) heart failure: Secondary | ICD-10-CM | POA: Diagnosis not present

## 2022-10-18 DIAGNOSIS — R6510 Systemic inflammatory response syndrome (SIRS) of non-infectious origin without acute organ dysfunction: Secondary | ICD-10-CM

## 2022-10-18 DIAGNOSIS — J42 Unspecified chronic bronchitis: Secondary | ICD-10-CM | POA: Diagnosis not present

## 2022-10-18 DIAGNOSIS — L03116 Cellulitis of left lower limb: Secondary | ICD-10-CM | POA: Diagnosis not present

## 2022-10-18 DIAGNOSIS — I483 Typical atrial flutter: Secondary | ICD-10-CM | POA: Diagnosis not present

## 2022-10-18 LAB — BASIC METABOLIC PANEL
Anion gap: 10 (ref 5–15)
BUN: 23 mg/dL (ref 8–23)
CO2: 24 mmol/L (ref 22–32)
Calcium: 9.1 mg/dL (ref 8.9–10.3)
Chloride: 102 mmol/L (ref 98–111)
Creatinine, Ser: 1.26 mg/dL — ABNORMAL HIGH (ref 0.61–1.24)
GFR, Estimated: >60 mL/min (ref >60)
Glucose, Bld: 136 mg/dL — ABNORMAL HIGH (ref 70–99)
Potassium: 4.2 mmol/L (ref 3.5–5.1)
Sodium: 136 mmol/L (ref 135–145)

## 2022-10-18 LAB — CBC
HCT: 40.4 % (ref 39.0–52.0)
Hemoglobin: 13.6 g/dL (ref 13.0–17.0)
MCH: 32.5 pg (ref 26.0–34.0)
MCHC: 33.7 g/dL (ref 30.0–36.0)
MCV: 96.7 fL (ref 80.0–100.0)
Platelets: 231 10*3/uL (ref 150–400)
RBC: 4.18 MIL/uL — ABNORMAL LOW (ref 4.22–5.81)
RDW: 13.7 % (ref 11.5–15.5)
WBC: 10 10*3/uL (ref 4.0–10.5)
nRBC: 0 % (ref 0.0–0.2)

## 2022-10-18 MED ORDER — METOPROLOL SUCCINATE ER 25 MG PO TB24
25.0000 mg | ORAL_TABLET | Freq: Every day | ORAL | 6 refills | Status: AC
  Filled 2022-10-18: qty 30, 30d supply, fill #0

## 2022-10-18 MED ORDER — METOPROLOL SUCCINATE ER 25 MG PO TB24: 25.0000 mg | ORAL_TABLET | Freq: Every day | ORAL | Status: DC

## 2022-10-18 MED ORDER — AMIODARONE HCL 200 MG PO TABS
ORAL_TABLET | ORAL | 6 refills | Status: AC
  Filled 2022-10-18: qty 60, 23d supply, fill #0

## 2022-10-18 MED ORDER — NICOTINE 21 MG/24HR TD PT24
21.0000 mg | MEDICATED_PATCH | Freq: Every day | TRANSDERMAL | 0 refills | Status: AC
  Filled 2022-10-18: qty 28, 28d supply, fill #0

## 2022-10-18 MED ORDER — RIVAROXABAN 20 MG PO TABS: 20.0000 mg | ORAL_TABLET | Freq: Every day | ORAL | Status: AC

## 2022-10-18 MED ORDER — FUROSEMIDE 40 MG PO TABS
40.0000 mg | ORAL_TABLET | Freq: Every day | ORAL | 6 refills | Status: AC
  Filled 2022-10-18: qty 30, 30d supply, fill #0

## 2022-10-18 NOTE — Consult Note (Signed)
Amio, metoprolol, and lasix delivered to patient.   Paschal Dopp, PharmD, BCPS

## 2022-10-18 NOTE — Progress Notes (Signed)
Cardiology Progress Note   Patient Name: Jack Barber Date of Encounter: 10/18/2022  Primary Cardiologist: Lorine Bears, MD  Subjective   S/p TEE/DCCV yesterday.  Maintaining sinus rhythm.  Feels well.  No c/p or sob.  Ambulating w/o difficulty.  Inpatient Medications    Scheduled Meds:  amiodarone  400 mg Oral BID   apixaban  5 mg Oral BID   cefadroxil  500 mg Oral BID   fluticasone furoate-vilanterol  1 puff Inhalation Daily   metoprolol succinate  25 mg Oral Daily   nicotine  21 mg Transdermal Daily   potassium chloride  20 mEq Oral BID   Continuous Infusions:  PRN Meds: acetaminophen **OR** acetaminophen, albuterol, ALPRAZolam, HYDROcodone-acetaminophen, ondansetron **OR** ondansetron (ZOFRAN) IV   Vital Signs    Vitals:   10/17/22 2151 10/17/22 2332 10/18/22 0335 10/18/22 0741  BP: 106/69 94/69 111/72 117/79  Pulse: 75 69 65 65  Resp:  20 18   Temp:  98.4 F (36.9 C) 97.6 F (36.4 C) 97.6 F (36.4 C)  TempSrc:  Oral Oral   SpO2:  95% 97% 97%  Weight:      Height:        Intake/Output Summary (Last 24 hours) at 10/18/2022 0947 Last data filed at 10/18/2022 0944 Gross per 24 hour  Intake 620.92 ml  Output 1600 ml  Net -979.08 ml   Filed Weights   10/14/22 0207 10/16/22 0402 10/17/22 1205  Weight: 79 kg 82.3 kg 82.3 kg    Physical Exam   GEN: Well nourished, well developed, in no acute distress.  HEENT: Grossly normal.  Neck: Supple, mildly elev JVP, no carotid bruits or masses. Cardiac: RRR, distant, no murmurs, rubs, or gallops. No clubbing, cyanosis, edema.  Radials 2+, DP/PT 2+ and equal bilaterally.  Respiratory:  Respirations regular and unlabored, diminished breath sounds bilat. GI: Soft, nontender, nondistended, BS + x 4. MS: no deformity or atrophy. Skin: warm and dry, no rash. Neuro:  Strength and sensation are intact. Psych: AAOx3.  Normal affect.  Labs    Chemistry Recent Labs  Lab 10/14/22 0243 10/15/22 0536  10/16/22 0426 10/17/22 0351 10/18/22 0413  NA  --    < > 138 139 136  K  --    < > 4.0 3.9 4.2  CL  --    < > 105 105 102  CO2  --    < > 24 26 24   GLUCOSE  --    < > 118* 108* 136*  BUN  --    < > 18 21 23   CREATININE  --    < > 1.04 0.98 1.26*  CALCIUM  --    < > 8.9 9.2 9.1  PROT 6.3*  --   --   --   --   ALBUMIN 4.2  --   --   --   --   AST 26  --   --   --   --   ALT 27  --   --   --   --   ALKPHOS 48  --   --   --   --   BILITOT 1.1  --   --   --   --   GFRNONAA  --    < > >60 >60 >60  ANIONGAP  --    < > 9 8 10    < > = values in this interval not displayed.     Hematology Recent Labs  Lab  10/16/22 0426 10/17/22 0351 10/18/22 0413  WBC 10.4 9.1 10.0  RBC 4.07* 4.19* 4.18*  HGB 13.2 13.6 13.6  HCT 39.6 40.1 40.4  MCV 97.3 95.7 96.7  MCH 32.4 32.5 32.5  MCHC 33.3 33.9 33.7  RDW 13.7 13.9 13.7  PLT 214 202 231    Cardiac Enzymes  Recent Labs  Lab 10/14/22 0227 10/14/22 0629  TROPONINIHS 17 14      BNP    Component Value Date/Time   BNP 228.8 (H) 10/15/2022 0536    DDimer  Recent Labs  Lab 10/14/22 1447  DDIMER <0.27     Lipids  Lab Results  Component Value Date   CHOL 100 03/23/2021   HDL 22 (L) 03/23/2021   LDLCALC 67 03/23/2021   TRIG 57 03/23/2021   CHOLHDL 4.5 03/23/2021    HbA1c  Lab Results  Component Value Date   HGBA1C 5.2 03/23/2021    Radiology    CT Angio Chest Pulmonary Embolism (PE) W or WO Contrast  Result Date: 10/14/2022 CLINICAL DATA:  Suspected pulmonary embolism. EXAM: CT ANGIOGRAPHY CHEST WITH CONTRAST TECHNIQUE: Multidetector CT imaging of the chest was performed using the standard protocol during bolus administration of intravenous contrast. Multiplanar CT image reconstructions and MIPs were obtained to evaluate the vascular anatomy. RADIATION DOSE REDUCTION: This exam was performed according to the departmental dose-optimization program which includes automated exposure control, adjustment of the mA and/or kV  according to patient size and/or use of iterative reconstruction technique. CONTRAST:  80mL OMNIPAQUE IOHEXOL 350 MG/ML SOLN COMPARISON:  None Available. FINDINGS: Cardiovascular: There is mild calcification of the aortic arch, without evidence of aortic aneurysm. Satisfactory opacification of the pulmonary arteries to the segmental level. No evidence of pulmonary embolism. Normal heart size with mild to moderate severity coronary artery calcification. No pericardial effusion. Mediastinum/Nodes: No enlarged mediastinal, hilar, or axillary lymph nodes. Thyroid gland, trachea, and esophagus demonstrate no significant findings. Lungs/Pleura: There is no evidence of an acute infiltrate, pleural effusion or pneumothorax. Upper Abdomen: No acute abnormality. Musculoskeletal: Multilevel degenerative changes are seen throughout the thoracic spine. Review of the MIP images confirms the above findings. IMPRESSION: 1. No evidence of pulmonary embolism or acute cardiopulmonary disease. 2. Aortic atherosclerosis. Aortic Atherosclerosis (ICD10-I70.0). Electronically Signed   By: Aram Candela M.D.   On: 10/14/2022 17:45   US Venous Img Lower Bilateral (DVT)  Result Date: 10/14/2022 CLINICAL DATA:  Bilateral lower extremity edema and pain. EXAM: BILATERAL LOWER EXTREMITY VENOUS DOPPLER ULTRASOUND TECHNIQUE: Gray-scale sonography with graded compression, as well as color Doppler and duplex ultrasound were performed to evaluate the lower extremity deep venous systems from the level of the common femoral vein and including the common femoral, femoral, profunda femoral, popliteal and calf veins including the posterior tibial, peroneal and gastrocnemius veins when visible. The superficial great saphenous vein was also interrogated. Spectral Doppler was utilized to evaluate flow at rest and with distal augmentation maneuvers in the common femoral, femoral and popliteal veins. COMPARISON:  None Available. FINDINGS: RIGHT LOWER  EXTREMITY Common Femoral Vein: No evidence of thrombus. Normal compressibility, respiratory phasicity and response to augmentation. Saphenofemoral Junction: No evidence of thrombus. Normal compressibility and flow on color Doppler imaging. Profunda Femoral Vein: No evidence of thrombus. Normal compressibility and flow on color Doppler imaging. Femoral Vein: No evidence of thrombus. Normal compressibility, respiratory phasicity and response to augmentation. Popliteal Vein: No evidence of thrombus. Normal compressibility, respiratory phasicity and response to augmentation. Calf Veins: No evidence of thrombus. Normal compressibility and  flow on color Doppler imaging. Superficial Great Saphenous Vein: No evidence of thrombus. Normal compressibility. Venous Reflux:  None. Other Findings: No evidence of superficial thrombophlebitis or abnormal fluid collection. LEFT LOWER EXTREMITY Common Femoral Vein: No evidence of thrombus. Normal compressibility, respiratory phasicity and response to augmentation. Saphenofemoral Junction: No evidence of thrombus. Normal compressibility and flow on color Doppler imaging. Profunda Femoral Vein: No evidence of thrombus. Normal compressibility and flow on color Doppler imaging. Femoral Vein: No evidence of thrombus. Normal compressibility, respiratory phasicity and response to augmentation. Popliteal Vein: No evidence of thrombus. Normal compressibility, respiratory phasicity and response to augmentation. Calf Veins: No evidence of thrombus. Normal compressibility and flow on color Doppler imaging. Superficial Great Saphenous Vein: No evidence of thrombus. Normal compressibility. Venous Reflux:  None. Other Findings: No evidence of superficial thrombophlebitis or abnormal fluid collection. IMPRESSION: No evidence of deep venous thrombosis in either lower extremity. Electronically Signed   By: Irish Lack M.D.   On: 10/14/2022 15:42    Telemetry    RSR, PACs - Personally  Reviewed  Cardiac Studies   2D Echocardiogram 9.16.2024  1. Left ventricular ejection fraction, by estimation, is 30 to 35%. The  left ventricle has moderately decreased function. Left ventricular  endocardial border not optimally defined to evaluate regional wall motion.  There is mild left ventricular  hypertrophy. Left ventricular diastolic parameters are indeterminate.   2. Right ventricular systolic function is normal. The right ventricular  size is normal. There is normal pulmonary artery systolic pressure.   3. Left atrial size was mildly dilated.   4. Right atrial size was mildly dilated.   5. The mitral valve is normal in structure. Moderate mitral valve  regurgitation. No evidence of mitral stenosis.   6. Tricuspid valve regurgitation is severe.   7. The aortic valve is calcified. Aortic valve regurgitation is not  visualized. Aortic valve sclerosis/calcification is present, without any  evidence of aortic stenosis.   8. The inferior vena cava is normal in size with <50% respiratory  variability, suggesting right atrial pressure of 8 mmHg. _____________   Transesophageal Echocardiogram & Cardioversion 9.19.2024  1. Left ventricular ejection fraction, by estimation, is 30 to 35%. The  left ventricle has moderately decreased function. The left ventricle  demonstrates global hypokinesis.   2. Right ventricular systolic function is moderately reduced. The right  ventricular size is normal.   3. Left atrial size was moderately dilated. No left atrial/left atrial  appendage thrombus was detected.   4. Right atrial size was moderately dilated.   5. The mitral valve is normal in structure. Mild to moderate mitral valve  regurgitation. No evidence of mitral stenosis.   6. The aortic valve is tricuspid. Aortic valve regurgitation is not  visualized. Aortic valve sclerosis/calcification is present, without any  evidence of aortic stenosis.   7. There is mild (Grade II) atheroma  plaque involving the aortic arch.   8. The inferior vena cava is normal in size with greater than 50%  respiratory variability, suggesting right atrial pressure of 3 mmHg.   *s/p successful DCCV _____________   Patient Profile     67 y.o. male w a h/o homelessness, atrial flutter, noncompliance,  etoh abuse, and tob abuse, who was admitted w/ rapid atrial flutter.  EF 30-35%. S/p TEE/DCCV 10/17/2022.  Assessment & Plan    1.  Atrial flutter w/ RVR:  H/o aflutter in 2023 - converted on amio @ that time.  Lost to f/u.  Admitted 9/16 w/  rapid aflutter.  S/p TEE/DCCV 9/19.  Maintaining sinus.   Cont amio 400 BID x 1 wk w/ plan to reduce to 200 BID.  Further reduction @ outpt f/u.  Cont eliquis - 30 day coupon provided w/ plan to convert to xarelto via assistance program (felt to be easier to obtain for him).  Paperwork already faxed.  Discussed importance of compliance and f/u.  Rx sent in for amio and Toprol XL to [facility] pharmacy.  2.  Cardiomyopathy:  In setting of #1, EF 30-35%, mild-mod MR, mod reduced RV fxn.  BUN/Creat bumping this AM.  He has already received AM dose of IV lasix.  Minus 1.4L overnight and minus 4.1 L since admission.  Euvolemic on exam. Wt pending.  Will transition ? blocker to toprol xl (already received tartrate this AM).  BP soft - no room for acei/arb/arni/mra.  SGLT2i not financially feasible.  Needs early outpt f/u w/ [place] and CHF clinic.  3.  LE cellulitis:  In setting of ant bites.  Abx per IM>  4.  Tob abuse:  cessation advised.  He is interested in Rx for wellbutrin XL.  Defer to medicine team.  Chantix has worked for him in the past but he doesn't think he'll be able to afford.  5.  AKI: BUN/Creat up sl this AM.  D/c IV lasix.  Will add lasix 40 daily @ discharge.  Needs early outpt f/u w/ bmet next week.  Signed, Nicolasa Ducking, NP  10/18/2022, 9:47 AM    For questions or updates, please contact   Please consult [email or web address] for contact info under  Cardiology/STEMI.

## 2022-10-18 NOTE — Progress Notes (Signed)
[facility] HF Stewardship  PCP: Mick Sell, MD  PCP-Cardiologist: None  HPI: Jack Barber is a 67 y.o. male with Homelessness, depression, alcohol use disorder, chronic pain, HTN, A-flutter previously on amiodarone and Xarelto, COPD who presented with pain, swelling, and redness of bilateral lower extremities. On admission, temperature noted to be 100.2 and WBC 15K. Started on vancomycin and ceftriaxone. EKG showed atrial flutter with RBBB. Troponin negative on admission. TTE showed LVEF of 30-35%, normal RV, severe TR, moderate MR, and aortic sclerosis without stenosis. BNP mildly elevated at 228.8 this AM after 2 days of IV Lasix. Successful TEE guided DCCV on 10/17/22.   Pertinent cardiac history: Diagnosed with atrial flutter in 2023 and converted to sinus rhythm with amiodarone. LVEF at that time showed normal LVEF. Was planning EP evaluation for ablation, but was lost to follow-up.  Pertinent Lab Values: Creatinine, Ser  Date Value Ref Range Status  10/18/2022 1.26 (H) 0.61 - 1.24 mg/dL Final   BUN  Date Value Ref Range Status  10/18/2022 23 8 - 23 mg/dL Final   Potassium  Date Value Ref Range Status  10/18/2022 4.2 3.5 - 5.1 mmol/L Final   Sodium  Date Value Ref Range Status  10/18/2022 136 135 - 145 mmol/L Final   B Natriuretic Peptide  Date Value Ref Range Status  10/15/2022 228.8 (H) 0.0 - 100.0 pg/mL Final    Comment:    Performed at [facility], [address] [place], [place] [address]   Magnesium  Date Value Ref Range Status  10/17/2022 2.2 1.7 - 2.4 mg/dL Final    Comment:    Performed at [facility], [address] [place], [place] [address]   Hgb A1c MFr Bld  Date Value Ref Range Status  03/23/2021 5.2 4.8 - 5.6 % Final    Comment:    (NOTE) Pre diabetes:          5.7%-6.4%  Diabetes:              >6.4%  Glycemic control for   <7.0% adults with diabetes    TSH  Date Value Ref Range Status  10/14/2022 0.868 0.350  - 4.500 uIU/mL Final    Comment:    Performed by a 3rd Generation assay with a functional sensitivity of <=0.01 uIU/mL. Performed at [facility], [address] [place], [place] [address]     Vital Signs: Temp:  [97.6 F (36.4 C)-98.4 F (36.9 C)] 97.6 F (36.4 C) (09/20 0741) Pulse Rate:  [39-86] 65 (09/20 0741) Cardiac Rhythm: Normal sinus rhythm;Bundle branch block (09/19 2108) Resp:  [18-32] 18 (09/20 0335) BP: (68-117)/(55-96) 117/79 (09/20 0741) SpO2:  [77 %-100 %] 97 % (09/20 0741) Weight:  [82.3 kg (181 lb 7 oz)] 82.3 kg (181 lb 7 oz) (09/19 1205)   Intake/Output Summary (Last 24 hours) at 10/18/2022 0850 Last data filed at 10/17/2022 2203 Gross per 24 hour  Intake 620.92 ml  Output 1300 ml  Net -679.08 ml    Current Inpatient HF Medications:  -Furosemide 40 mg IV BID -Meotprolol tartrate 12.5 mg BID  Prior to admission HF Medications:  -None  Assessment: 1. Acute heart failure (LVEF 30-35%), due to presumed NICM. NYHA class III-IV symptoms.  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 Rate now 60s in sinus rhythm after DCCV. Amiodarone 400 BID. -BP improved today. Financial situation makes GDMT addition difficult and patient reports difficulty with transportation to follow up appointments. BP stable for spironolactone and may  help decrease daily K supplementation. Potassium is often expensive with insurance and this may save on costs and pill burden. Can consider if BP remains stable after DVVC. Follow up labs, however, would be a concern. -Now 3.8L net negative. Weight charted as up 7 lbs from admit, but doubt accuracy. Improvement to shortness of breath. . -Started on low dose metoprolol for rate control. Can convert to metoprolol succinate now that in sinus rhythm and normal rates. -Creatinine increased. Consider holding diuretics today.  Plan: 1) Medication changes recommended at this time: -Consider transition from metoprolol  tartrate to metoprolol succinate 25 mg daily -Consider holding IV furosemide today  2) Patient assistance: -Copays for Sherryll Burger, Farxiga, and Jardiance are $47.  -Patient has housing instability and no transportation -Unfortunately does not meet income cutoff for Medicare extra help, but may qualify for manufacturer's assistance. -Would benefit from transportation assistance for clinic visits.  3) Education: - Patient has been educated on current HF medications and potential additions to HF medication regimen - Patient verbalizes understanding that over the next few months, these medication doses may change and more medications may be added to optimize HF regimen - Patient has been educated on basic disease state pathophysiology and goals of therapy  Medication Assistance / Insurance Benefits Check:  Does the patient have prescription insurance? Prescription Insurance: Medicare   Type of insurance plan:   Does the patient qualify for medication assistance through manufacturers or grants? Yes   Eligible grants and/or patient assistance programs: Valla Leaver   Medication assistance applications in progress: none   Medication assistance applications approved: none  Approved medication assistance renewals will be completed by: pending  Outpatient Pharmacy:  Prior to admission outpatient pharmacy: none   Is the patient willing to utilize a [place] pharmacy at discharge?: Yes Thank you for involving pharmacy in this patient's care.  Enos Fling, PharmD, BCPS Phone - [phone] Clinical Pharmacist 10/18/2022 8:50 AM

## 2022-10-18 NOTE — Discharge Summary (Signed)
Height:       68.0 in Accession #:    [id number]         Weight:       174.2 lb Date of Birth:  September 01, 1955         BSA:          1.927 m Patient Age:    67 years           BP:           72/61 mmHg Patient Gender: M                  HR:           106 bpm. Exam Location:  [facility] Procedure: 2D Echo, Cardiac Doppler and Color Doppler Indications:     Abnormal ECG  History:         Patient has prior history of Echocardiogram examinations, most                  recent 03/23/2021. Abnormal ECG; Arrythmias:Atrial Flutter. ETOH                  abuse.  Sonographer:     Mikki Harbor Referring Phys:  [id number] Gillis Santa Diagnosing Phys: Lorine Bears MD IMPRESSIONS  1. Left ventricular ejection fraction, by estimation, is 30 to 35%. The left ventricle has moderately decreased function. Left ventricular endocardial border not optimally defined to evaluate regional wall motion. There is mild left ventricular hypertrophy. Left ventricular diastolic parameters are indeterminate.  2. Right ventricular systolic function is normal. The right ventricular size is normal. There is normal pulmonary artery systolic pressure.  3. Left atrial size was mildly dilated.  4. Right atrial size was mildly dilated.  5.  The mitral valve is normal in structure. Moderate mitral valve regurgitation. No evidence of mitral stenosis.  6. Tricuspid valve regurgitation is severe.  7. The aortic valve is calcified. Aortic valve regurgitation is not visualized. Aortic valve sclerosis/calcification is present, without any evidence of aortic stenosis.  8. The inferior vena cava is normal in size with <50% respiratory variability, suggesting right atrial pressure of 8 mmHg. FINDINGS  Left Ventricle: Left ventricular ejection fraction, by estimation, is 30 to 35%. The left ventricle has moderately decreased function. Left ventricular endocardial border not optimally defined to evaluate regional wall motion. The left ventricular internal cavity size was normal in size. There is mild left ventricular hypertrophy. Left ventricular diastolic parameters are indeterminate. Right Ventricle: The right ventricular size is normal. No increase in right ventricular wall thickness. Right ventricular systolic function is normal. There is normal pulmonary artery systolic pressure. The tricuspid regurgitant velocity is 2.50 m/s, and  with an assumed right atrial pressure of 8 mmHg, the estimated right ventricular systolic pressure is 33.0 mmHg. Left Atrium: Left atrial size was mildly dilated. Right Atrium: Right atrial size was mildly dilated. Pericardium: There is no evidence of pericardial effusion. Mitral Valve: The mitral valve is normal in structure. There is moderate thickening of the mitral valve leaflet(s). Moderate mitral valve regurgitation. No evidence of mitral valve stenosis. MV peak gradient, 1.9 mmHg. The mean mitral valve gradient is 1.0 mmHg. Tricuspid Valve: The tricuspid valve is normal in structure. Tricuspid valve regurgitation is severe. No evidence of tricuspid stenosis. Aortic Valve: The aortic valve is calcified. Aortic valve regurgitation is not visualized. Aortic valve sclerosis/calcification is present, without any evidence of  aortic stenosis. Aortic valve mean gradient measures 5.3 mmHg. Aortic valve peak gradient  Physician Discharge Summary   Patient: Jack Barber MRN: [id number] DOB: 02-17-1955  Admit date:     10/14/2022  Discharge date: 10/18/22  Discharge Physician: Marcelino Duster   PCP: Mick Sell, MD   Recommendations at discharge:  {Tip this will not be part of the note when signed- Example include specific recommendations for outpatient follow-up, pending tests to follow-up on. (Optional):26781}  ***  Discharge Diagnoses: Principal Problem:   Cellulitis of lower extremity Active Problems:   SIRS (systemic inflammatory response syndrome) (HCC)   Acute sepsis (HCC)   Atrial flutter (HCC)   History of atrial flutter   History of alcohol use disorder   Chronic bronchitis (HCC)   Acute HFrEF (heart failure with reduced ejection fraction) (HCC)   SOB (shortness of breath)  Resolved Problems:   * No resolved hospital problems. *  Hospital Course: No notes on file  Assessment and Plan: * Cellulitis of lower extremity Sepsis Patient was tachycardic and had a rectal temp of 100.2, WBC 15,000 but normal lactic acid and procalcitonin Sepsis fluids Continue Rocephin and vancomycin Keep legs elevated  Chronic bronchitis (HCC) Not acutely exacerbated DuoNebs as needed  History of alcohol use disorder States he no longer drinks  History of atrial flutter Patient was previously on amiodarone and Xarelto but no longer follows up and has not take the medication in a long time Not currently in A-fib flutter       {Tip this will not be part of the note when signed Body mass index is 27.59 kg/m. , ,  (Optional):26781}  {(NOTE) Pain control PDMP Statment (Optional):26782} Consultants: *** Procedures performed: ***  Disposition: {Plan; Disposition:26390} Diet recommendation:  Discharge Diet Orders (From admission, onward)     Start     Ordered   10/18/22 0000  Diet - low sodium heart healthy        10/18/22 1142            {Diet_Plan:26776} DISCHARGE MEDICATION: Allergies as of 10/18/2022       Reactions   Codeine Itching        Medication List     STOP taking these medications    diclofenac 75 MG EC tablet Commonly known as: VOLTAREN   DULoxetine 60 MG capsule Commonly known as: CYMBALTA   ibuprofen 800 MG tablet Commonly known as: ADVIL   omeprazole 20 MG capsule Commonly known as: PRILOSEC   predniSONE 10 MG tablet Commonly known as: DELTASONE       TAKE these medications    acetaminophen 325 MG tablet Commonly known as: TYLENOL Take 2 tablets (650 mg total) by mouth every 4 (four) hours as needed for headache or mild pain.   amiodarone 200 MG tablet Commonly known as: PACERONE Take 2 tablets by mouth twice daily for 7 days, then 1 tablet by mouth twice daily What changed:  how much to take how to take this when to take this additional instructions   Breo Ellipta 200-25 MCG/ACT Aepb Generic drug: fluticasone furoate-vilanterol Inhale 1 puff into the lungs daily.   Eliquis 5 MG Tabs tablet Generic drug: apixaban Take 1 tablet (5 mg total) by mouth 2 (two) times daily.   furosemide 40 MG tablet Commonly known as: Lasix Take 1 tablet (40 mg total) by mouth daily.   metoprolol succinate 25 MG 24 hr tablet Commonly known as: TOPROL-XL Take 1 tablet (25 mg total) by mouth daily.   nicotine 21 mg/24hr patch Commonly known as: NICODERM CQ - dosed  Height:       68.0 in Accession #:    [id number]         Weight:       174.2 lb Date of Birth:  September 01, 1955         BSA:          1.927 m Patient Age:    67 years           BP:           72/61 mmHg Patient Gender: M                  HR:           106 bpm. Exam Location:  [facility] Procedure: 2D Echo, Cardiac Doppler and Color Doppler Indications:     Abnormal ECG  History:         Patient has prior history of Echocardiogram examinations, most                  recent 03/23/2021. Abnormal ECG; Arrythmias:Atrial Flutter. ETOH                  abuse.  Sonographer:     Mikki Harbor Referring Phys:  [id number] Gillis Santa Diagnosing Phys: Lorine Bears MD IMPRESSIONS  1. Left ventricular ejection fraction, by estimation, is 30 to 35%. The left ventricle has moderately decreased function. Left ventricular endocardial border not optimally defined to evaluate regional wall motion. There is mild left ventricular hypertrophy. Left ventricular diastolic parameters are indeterminate.  2. Right ventricular systolic function is normal. The right ventricular size is normal. There is normal pulmonary artery systolic pressure.  3. Left atrial size was mildly dilated.  4. Right atrial size was mildly dilated.  5.  The mitral valve is normal in structure. Moderate mitral valve regurgitation. No evidence of mitral stenosis.  6. Tricuspid valve regurgitation is severe.  7. The aortic valve is calcified. Aortic valve regurgitation is not visualized. Aortic valve sclerosis/calcification is present, without any evidence of aortic stenosis.  8. The inferior vena cava is normal in size with <50% respiratory variability, suggesting right atrial pressure of 8 mmHg. FINDINGS  Left Ventricle: Left ventricular ejection fraction, by estimation, is 30 to 35%. The left ventricle has moderately decreased function. Left ventricular endocardial border not optimally defined to evaluate regional wall motion. The left ventricular internal cavity size was normal in size. There is mild left ventricular hypertrophy. Left ventricular diastolic parameters are indeterminate. Right Ventricle: The right ventricular size is normal. No increase in right ventricular wall thickness. Right ventricular systolic function is normal. There is normal pulmonary artery systolic pressure. The tricuspid regurgitant velocity is 2.50 m/s, and  with an assumed right atrial pressure of 8 mmHg, the estimated right ventricular systolic pressure is 33.0 mmHg. Left Atrium: Left atrial size was mildly dilated. Right Atrium: Right atrial size was mildly dilated. Pericardium: There is no evidence of pericardial effusion. Mitral Valve: The mitral valve is normal in structure. There is moderate thickening of the mitral valve leaflet(s). Moderate mitral valve regurgitation. No evidence of mitral valve stenosis. MV peak gradient, 1.9 mmHg. The mean mitral valve gradient is 1.0 mmHg. Tricuspid Valve: The tricuspid valve is normal in structure. Tricuspid valve regurgitation is severe. No evidence of tricuspid stenosis. Aortic Valve: The aortic valve is calcified. Aortic valve regurgitation is not visualized. Aortic valve sclerosis/calcification is present, without any evidence of  aortic stenosis. Aortic valve mean gradient measures 5.3 mmHg. Aortic valve peak gradient  Height:       68.0 in Accession #:    [id number]         Weight:       174.2 lb Date of Birth:  September 01, 1955         BSA:          1.927 m Patient Age:    67 years           BP:           72/61 mmHg Patient Gender: M                  HR:           106 bpm. Exam Location:  [facility] Procedure: 2D Echo, Cardiac Doppler and Color Doppler Indications:     Abnormal ECG  History:         Patient has prior history of Echocardiogram examinations, most                  recent 03/23/2021. Abnormal ECG; Arrythmias:Atrial Flutter. ETOH                  abuse.  Sonographer:     Mikki Harbor Referring Phys:  [id number] Gillis Santa Diagnosing Phys: Lorine Bears MD IMPRESSIONS  1. Left ventricular ejection fraction, by estimation, is 30 to 35%. The left ventricle has moderately decreased function. Left ventricular endocardial border not optimally defined to evaluate regional wall motion. There is mild left ventricular hypertrophy. Left ventricular diastolic parameters are indeterminate.  2. Right ventricular systolic function is normal. The right ventricular size is normal. There is normal pulmonary artery systolic pressure.  3. Left atrial size was mildly dilated.  4. Right atrial size was mildly dilated.  5.  The mitral valve is normal in structure. Moderate mitral valve regurgitation. No evidence of mitral stenosis.  6. Tricuspid valve regurgitation is severe.  7. The aortic valve is calcified. Aortic valve regurgitation is not visualized. Aortic valve sclerosis/calcification is present, without any evidence of aortic stenosis.  8. The inferior vena cava is normal in size with <50% respiratory variability, suggesting right atrial pressure of 8 mmHg. FINDINGS  Left Ventricle: Left ventricular ejection fraction, by estimation, is 30 to 35%. The left ventricle has moderately decreased function. Left ventricular endocardial border not optimally defined to evaluate regional wall motion. The left ventricular internal cavity size was normal in size. There is mild left ventricular hypertrophy. Left ventricular diastolic parameters are indeterminate. Right Ventricle: The right ventricular size is normal. No increase in right ventricular wall thickness. Right ventricular systolic function is normal. There is normal pulmonary artery systolic pressure. The tricuspid regurgitant velocity is 2.50 m/s, and  with an assumed right atrial pressure of 8 mmHg, the estimated right ventricular systolic pressure is 33.0 mmHg. Left Atrium: Left atrial size was mildly dilated. Right Atrium: Right atrial size was mildly dilated. Pericardium: There is no evidence of pericardial effusion. Mitral Valve: The mitral valve is normal in structure. There is moderate thickening of the mitral valve leaflet(s). Moderate mitral valve regurgitation. No evidence of mitral valve stenosis. MV peak gradient, 1.9 mmHg. The mean mitral valve gradient is 1.0 mmHg. Tricuspid Valve: The tricuspid valve is normal in structure. Tricuspid valve regurgitation is severe. No evidence of tricuspid stenosis. Aortic Valve: The aortic valve is calcified. Aortic valve regurgitation is not visualized. Aortic valve sclerosis/calcification is present, without any evidence of  aortic stenosis. Aortic valve mean gradient measures 5.3 mmHg. Aortic valve peak gradient  Height:       68.0 in Accession #:    [id number]         Weight:       174.2 lb Date of Birth:  September 01, 1955         BSA:          1.927 m Patient Age:    67 years           BP:           72/61 mmHg Patient Gender: M                  HR:           106 bpm. Exam Location:  [facility] Procedure: 2D Echo, Cardiac Doppler and Color Doppler Indications:     Abnormal ECG  History:         Patient has prior history of Echocardiogram examinations, most                  recent 03/23/2021. Abnormal ECG; Arrythmias:Atrial Flutter. ETOH                  abuse.  Sonographer:     Mikki Harbor Referring Phys:  [id number] Gillis Santa Diagnosing Phys: Lorine Bears MD IMPRESSIONS  1. Left ventricular ejection fraction, by estimation, is 30 to 35%. The left ventricle has moderately decreased function. Left ventricular endocardial border not optimally defined to evaluate regional wall motion. There is mild left ventricular hypertrophy. Left ventricular diastolic parameters are indeterminate.  2. Right ventricular systolic function is normal. The right ventricular size is normal. There is normal pulmonary artery systolic pressure.  3. Left atrial size was mildly dilated.  4. Right atrial size was mildly dilated.  5.  The mitral valve is normal in structure. Moderate mitral valve regurgitation. No evidence of mitral stenosis.  6. Tricuspid valve regurgitation is severe.  7. The aortic valve is calcified. Aortic valve regurgitation is not visualized. Aortic valve sclerosis/calcification is present, without any evidence of aortic stenosis.  8. The inferior vena cava is normal in size with <50% respiratory variability, suggesting right atrial pressure of 8 mmHg. FINDINGS  Left Ventricle: Left ventricular ejection fraction, by estimation, is 30 to 35%. The left ventricle has moderately decreased function. Left ventricular endocardial border not optimally defined to evaluate regional wall motion. The left ventricular internal cavity size was normal in size. There is mild left ventricular hypertrophy. Left ventricular diastolic parameters are indeterminate. Right Ventricle: The right ventricular size is normal. No increase in right ventricular wall thickness. Right ventricular systolic function is normal. There is normal pulmonary artery systolic pressure. The tricuspid regurgitant velocity is 2.50 m/s, and  with an assumed right atrial pressure of 8 mmHg, the estimated right ventricular systolic pressure is 33.0 mmHg. Left Atrium: Left atrial size was mildly dilated. Right Atrium: Right atrial size was mildly dilated. Pericardium: There is no evidence of pericardial effusion. Mitral Valve: The mitral valve is normal in structure. There is moderate thickening of the mitral valve leaflet(s). Moderate mitral valve regurgitation. No evidence of mitral valve stenosis. MV peak gradient, 1.9 mmHg. The mean mitral valve gradient is 1.0 mmHg. Tricuspid Valve: The tricuspid valve is normal in structure. Tricuspid valve regurgitation is severe. No evidence of tricuspid stenosis. Aortic Valve: The aortic valve is calcified. Aortic valve regurgitation is not visualized. Aortic valve sclerosis/calcification is present, without any evidence of  aortic stenosis. Aortic valve mean gradient measures 5.3 mmHg. Aortic valve peak gradient  Height:       68.0 in Accession #:    [id number]         Weight:       174.2 lb Date of Birth:  September 01, 1955         BSA:          1.927 m Patient Age:    67 years           BP:           72/61 mmHg Patient Gender: M                  HR:           106 bpm. Exam Location:  [facility] Procedure: 2D Echo, Cardiac Doppler and Color Doppler Indications:     Abnormal ECG  History:         Patient has prior history of Echocardiogram examinations, most                  recent 03/23/2021. Abnormal ECG; Arrythmias:Atrial Flutter. ETOH                  abuse.  Sonographer:     Mikki Harbor Referring Phys:  [id number] Gillis Santa Diagnosing Phys: Lorine Bears MD IMPRESSIONS  1. Left ventricular ejection fraction, by estimation, is 30 to 35%. The left ventricle has moderately decreased function. Left ventricular endocardial border not optimally defined to evaluate regional wall motion. There is mild left ventricular hypertrophy. Left ventricular diastolic parameters are indeterminate.  2. Right ventricular systolic function is normal. The right ventricular size is normal. There is normal pulmonary artery systolic pressure.  3. Left atrial size was mildly dilated.  4. Right atrial size was mildly dilated.  5.  The mitral valve is normal in structure. Moderate mitral valve regurgitation. No evidence of mitral stenosis.  6. Tricuspid valve regurgitation is severe.  7. The aortic valve is calcified. Aortic valve regurgitation is not visualized. Aortic valve sclerosis/calcification is present, without any evidence of aortic stenosis.  8. The inferior vena cava is normal in size with <50% respiratory variability, suggesting right atrial pressure of 8 mmHg. FINDINGS  Left Ventricle: Left ventricular ejection fraction, by estimation, is 30 to 35%. The left ventricle has moderately decreased function. Left ventricular endocardial border not optimally defined to evaluate regional wall motion. The left ventricular internal cavity size was normal in size. There is mild left ventricular hypertrophy. Left ventricular diastolic parameters are indeterminate. Right Ventricle: The right ventricular size is normal. No increase in right ventricular wall thickness. Right ventricular systolic function is normal. There is normal pulmonary artery systolic pressure. The tricuspid regurgitant velocity is 2.50 m/s, and  with an assumed right atrial pressure of 8 mmHg, the estimated right ventricular systolic pressure is 33.0 mmHg. Left Atrium: Left atrial size was mildly dilated. Right Atrium: Right atrial size was mildly dilated. Pericardium: There is no evidence of pericardial effusion. Mitral Valve: The mitral valve is normal in structure. There is moderate thickening of the mitral valve leaflet(s). Moderate mitral valve regurgitation. No evidence of mitral valve stenosis. MV peak gradient, 1.9 mmHg. The mean mitral valve gradient is 1.0 mmHg. Tricuspid Valve: The tricuspid valve is normal in structure. Tricuspid valve regurgitation is severe. No evidence of tricuspid stenosis. Aortic Valve: The aortic valve is calcified. Aortic valve regurgitation is not visualized. Aortic valve sclerosis/calcification is present, without any evidence of  aortic stenosis. Aortic valve mean gradient measures 5.3 mmHg. Aortic valve peak gradient  Physician Discharge Summary   Patient: Jack Barber MRN: [id number] DOB: 02-17-1955  Admit date:     10/14/2022  Discharge date: 10/18/22  Discharge Physician: Marcelino Duster   PCP: Mick Sell, MD   Recommendations at discharge:  {Tip this will not be part of the note when signed- Example include specific recommendations for outpatient follow-up, pending tests to follow-up on. (Optional):26781}  ***  Discharge Diagnoses: Principal Problem:   Cellulitis of lower extremity Active Problems:   SIRS (systemic inflammatory response syndrome) (HCC)   Acute sepsis (HCC)   Atrial flutter (HCC)   History of atrial flutter   History of alcohol use disorder   Chronic bronchitis (HCC)   Acute HFrEF (heart failure with reduced ejection fraction) (HCC)   SOB (shortness of breath)  Resolved Problems:   * No resolved hospital problems. *  Hospital Course: No notes on file  Assessment and Plan: * Cellulitis of lower extremity Sepsis Patient was tachycardic and had a rectal temp of 100.2, WBC 15,000 but normal lactic acid and procalcitonin Sepsis fluids Continue Rocephin and vancomycin Keep legs elevated  Chronic bronchitis (HCC) Not acutely exacerbated DuoNebs as needed  History of alcohol use disorder States he no longer drinks  History of atrial flutter Patient was previously on amiodarone and Xarelto but no longer follows up and has not take the medication in a long time Not currently in A-fib flutter       {Tip this will not be part of the note when signed Body mass index is 27.59 kg/m. , ,  (Optional):26781}  {(NOTE) Pain control PDMP Statment (Optional):26782} Consultants: *** Procedures performed: ***  Disposition: {Plan; Disposition:26390} Diet recommendation:  Discharge Diet Orders (From admission, onward)     Start     Ordered   10/18/22 0000  Diet - low sodium heart healthy        10/18/22 1142            {Diet_Plan:26776} DISCHARGE MEDICATION: Allergies as of 10/18/2022       Reactions   Codeine Itching        Medication List     STOP taking these medications    diclofenac 75 MG EC tablet Commonly known as: VOLTAREN   DULoxetine 60 MG capsule Commonly known as: CYMBALTA   ibuprofen 800 MG tablet Commonly known as: ADVIL   omeprazole 20 MG capsule Commonly known as: PRILOSEC   predniSONE 10 MG tablet Commonly known as: DELTASONE       TAKE these medications    acetaminophen 325 MG tablet Commonly known as: TYLENOL Take 2 tablets (650 mg total) by mouth every 4 (four) hours as needed for headache or mild pain.   amiodarone 200 MG tablet Commonly known as: PACERONE Take 2 tablets by mouth twice daily for 7 days, then 1 tablet by mouth twice daily What changed:  how much to take how to take this when to take this additional instructions   Breo Ellipta 200-25 MCG/ACT Aepb Generic drug: fluticasone furoate-vilanterol Inhale 1 puff into the lungs daily.   Eliquis 5 MG Tabs tablet Generic drug: apixaban Take 1 tablet (5 mg total) by mouth 2 (two) times daily.   furosemide 40 MG tablet Commonly known as: Lasix Take 1 tablet (40 mg total) by mouth daily.   metoprolol succinate 25 MG 24 hr tablet Commonly known as: TOPROL-XL Take 1 tablet (25 mg total) by mouth daily.   nicotine 21 mg/24hr patch Commonly known as: NICODERM CQ - dosed  Physician Discharge Summary   Patient: Jack Barber MRN: [id number] DOB: 02-17-1955  Admit date:     10/14/2022  Discharge date: 10/18/22  Discharge Physician: Marcelino Duster   PCP: Mick Sell, MD   Recommendations at discharge:  {Tip this will not be part of the note when signed- Example include specific recommendations for outpatient follow-up, pending tests to follow-up on. (Optional):26781}  ***  Discharge Diagnoses: Principal Problem:   Cellulitis of lower extremity Active Problems:   SIRS (systemic inflammatory response syndrome) (HCC)   Acute sepsis (HCC)   Atrial flutter (HCC)   History of atrial flutter   History of alcohol use disorder   Chronic bronchitis (HCC)   Acute HFrEF (heart failure with reduced ejection fraction) (HCC)   SOB (shortness of breath)  Resolved Problems:   * No resolved hospital problems. *  Hospital Course: No notes on file  Assessment and Plan: * Cellulitis of lower extremity Sepsis Patient was tachycardic and had a rectal temp of 100.2, WBC 15,000 but normal lactic acid and procalcitonin Sepsis fluids Continue Rocephin and vancomycin Keep legs elevated  Chronic bronchitis (HCC) Not acutely exacerbated DuoNebs as needed  History of alcohol use disorder States he no longer drinks  History of atrial flutter Patient was previously on amiodarone and Xarelto but no longer follows up and has not take the medication in a long time Not currently in A-fib flutter       {Tip this will not be part of the note when signed Body mass index is 27.59 kg/m. , ,  (Optional):26781}  {(NOTE) Pain control PDMP Statment (Optional):26782} Consultants: *** Procedures performed: ***  Disposition: {Plan; Disposition:26390} Diet recommendation:  Discharge Diet Orders (From admission, onward)     Start     Ordered   10/18/22 0000  Diet - low sodium heart healthy        10/18/22 1142            {Diet_Plan:26776} DISCHARGE MEDICATION: Allergies as of 10/18/2022       Reactions   Codeine Itching        Medication List     STOP taking these medications    diclofenac 75 MG EC tablet Commonly known as: VOLTAREN   DULoxetine 60 MG capsule Commonly known as: CYMBALTA   ibuprofen 800 MG tablet Commonly known as: ADVIL   omeprazole 20 MG capsule Commonly known as: PRILOSEC   predniSONE 10 MG tablet Commonly known as: DELTASONE       TAKE these medications    acetaminophen 325 MG tablet Commonly known as: TYLENOL Take 2 tablets (650 mg total) by mouth every 4 (four) hours as needed for headache or mild pain.   amiodarone 200 MG tablet Commonly known as: PACERONE Take 2 tablets by mouth twice daily for 7 days, then 1 tablet by mouth twice daily What changed:  how much to take how to take this when to take this additional instructions   Breo Ellipta 200-25 MCG/ACT Aepb Generic drug: fluticasone furoate-vilanterol Inhale 1 puff into the lungs daily.   Eliquis 5 MG Tabs tablet Generic drug: apixaban Take 1 tablet (5 mg total) by mouth 2 (two) times daily.   furosemide 40 MG tablet Commonly known as: Lasix Take 1 tablet (40 mg total) by mouth daily.   metoprolol succinate 25 MG 24 hr tablet Commonly known as: TOPROL-XL Take 1 tablet (25 mg total) by mouth daily.   nicotine 21 mg/24hr patch Commonly known as: NICODERM CQ - dosed

## 2022-10-18 NOTE — TOC Transition Note (Signed)
Transition of Care [facility]) - CM/SW Discharge Note   Patient Details  Name: Jack Barber MRN: [id number] Date of Birth: 1955/07/02  [facility]) CM/SW Contact:  Truddie Hidden, RN Phone Number: 10/18/2022, 12:51 PM   Clinical Narrative:    Attempt to see patient about transportation. Patient had already discharged.   TOC signing off.           Patient Goals and CMS Choice      Discharge Placement                         Discharge Plan and Services Additional resources added to the After Visit Summary for                                       Social Determinants of Health (SDOH) Interventions SDOH Screenings   Food Insecurity: Food Insecurity Present (10/14/2022)  Housing: High Risk (10/14/2022)  Transportation Needs: Unmet Transportation Needs (10/14/2022)  Utilities: At Risk (10/14/2022)  Tobacco Use: High Risk (10/17/2022)     Readmission Risk Interventions     No data to display

## 2022-10-18 NOTE — Consult Note (Signed)
Apixaban 30-day supply delivered to the patient and placed in patient's bookbag. Patient was educated on apixaban.   Paschal Dopp, PharmD, BCPS

## 2022-10-19 LAB — CULTURE, BLOOD (ROUTINE X 2)
Culture: NO GROWTH
Culture: NO GROWTH
Special Requests: ADEQUATE
Special Requests: ADEQUATE

## 2022-10-23 NOTE — Progress Notes (Deleted)
Advanced Heart Failure Clinic Note   Referring Physician: recent admit PCP: Mick Sell, MD Cardiologist: Lorine Bears, MD   HPI:  Mr Romm is a 67 y/o male with a history of  Admitted 10/14/22 due to pain, swelling and redness bilateral lower extremities. Noted to be tachycardic with low grade temp admitted to hospitalist service for IV antibiotic therapy for leg cellulitis. Found to be in a flutter, started on amiodarone drip, IV heparin.  Cardiologist recommended TEE cardioversion which was performed 10/17/2022 and he converted to normal sinus rhythm. DVT study negative.   Echo 03/23/21: EF 55-60% with Grade II DD, mildly elevated PA Pressure of 39.8 mmHg, mild MR, moderate TR Echo 10/14/22: EF 30-35% with mild LVH/ LAE,moderate MR, severe TR TEE 10/17/22: EF 30-35%, no atrial thrombus  He presents today for his initial visit with a chief complaint of    Review of Systems: [y] = yes, [ ]  = no   General: Weight gain [ ] ; Weight loss [ ] ; Anorexia [ ] ; Fatigue [ ] ; Fever [ ] ; Chills [ ] ; Weakness [ ]   Cardiac: Chest pain/pressure [ ] ; Resting SOB [ ] ; Exertional SOB [ ] ; Orthopnea [ ] ; Pedal Edema [ ] ; Palpitations [ ] ; Syncope [ ] ; Presyncope [ ] ; Paroxysmal nocturnal dyspnea[ ]   Pulmonary: Cough [ ] ; Wheezing[ ] ; Hemoptysis[ ] ; Sputum [ ] ; Snoring [ ]   GI: Vomiting[ ] ; Dysphagia[ ] ; Melena[ ] ; Hematochezia [ ] ; Heartburn[ ] ; Abdominal pain [ ] ; Constipation [ ] ; Diarrhea [ ] ; BRBPR [ ]   GU: Hematuria[ ] ; Dysuria [ ] ; Nocturia[ ]   Vascular: Pain in legs with walking [ ] ; Pain in feet with lying flat [ ] ; Non-healing sores [ ] ; Stroke [ ] ; TIA [ ] ; Slurred speech [ ] ;  Neuro: Headaches[ ] ; Vertigo[ ] ; Seizures[ ] ; Paresthesias[ ] ;Blurred vision [ ] ; Diplopia [ ] ; Vision changes [ ]   Ortho/Skin: Arthritis [ ] ; Joint pain [ ] ; Muscle pain [ ] ; Joint swelling [ ] ; Back Pain [ ] ; Rash [ ]   Psych: Depression[ ] ; Anxiety[ ]   Heme: Bleeding problems [ ] ; Clotting disorders [ ] ;  Anemia [ ]   Endocrine: Diabetes [ ] ; Thyroid dysfunction[ ]    Past Medical History:  Diagnosis Date   Alcohol abuse    Depression    Tobacco abuse     Current Outpatient Medications  Medication Sig Dispense Refill   acetaminophen (TYLENOL) 325 MG tablet Take 2 tablets (650 mg total) by mouth every 4 (four) hours as needed for headache or mild pain. 20 tablet 0   amiodarone (PACERONE) 200 MG tablet Take 2 tablets by mouth twice daily for 7 days, then 1 tablet by mouth twice daily 60 tablet 6   apixaban (ELIQUIS) 5 MG TABS tablet Take 1 tablet (5 mg total) by mouth 2 (two) times daily. 60 tablet 0   fluticasone furoate-vilanterol (BREO ELLIPTA) 200-25 MCG/ACT AEPB Inhale 1 puff into the lungs daily. (Patient not taking: Reported on 10/14/2022) 60 each 0   furosemide (LASIX) 40 MG tablet Take 1 tablet (40 mg total) by mouth daily. 30 tablet 6   metoprolol succinate (TOPROL-XL) 25 MG 24 hr tablet Take 1 tablet (25 mg total) by mouth daily. 30 tablet 6   nicotine (NICODERM CQ - DOSED IN MG/24 HOURS) 21 mg/24hr patch Place 1 patch (21 mg total) onto the skin daily. 28 patch 0   [START ON 11/15/2022] rivaroxaban (XARELTO) 20 MG TABS tablet Take 1 tablet (20 mg total) by mouth daily with  supper.     No current facility-administered medications for this visit.    Allergies  Allergen Reactions   Codeine Itching      Social History   Socioeconomic History   Marital status: Single    Spouse name: Not on file   Number of children: Not on file   Years of education: Not on file   Highest education level: Not on file  Occupational History   Not on file  Tobacco Use   Smoking status: Every Day    Current packs/day: 1.00    Types: Cigarettes   Smokeless tobacco: Not on file  Substance and Sexual Activity   Alcohol use: Not Currently    Alcohol/week: 1.0 standard drink of alcohol    Types: 1 Cans of beer per week    Comment: ATTENDING AA   Drug use: Not Currently   Sexual activity:  Not on file  Other Topics Concern   Not on file  Social History Narrative   Not on file   Social Determinants of Health   Financial Resource Strain: Not on file  Food Insecurity: Food Insecurity Present (10/14/2022)   Hunger Vital Sign    Worried About Programme researcher, broadcasting/film/video in the Last Year: Often true    Ran Out of Food in the Last Year: Often true  Transportation Needs: Unmet Transportation Needs (10/14/2022)   PRAPARE - Administrator, Civil Service (Medical): Yes    Lack of Transportation (Non-Medical): Yes  Physical Activity: Not on file  Stress: Not on file  Social Connections: Not on file  Intimate Partner Violence: Not At Risk (10/14/2022)   Humiliation, Afraid, Rape, and Kick questionnaire    Fear of Current or Ex-Partner: No    Emotionally Abused: No    Physically Abused: No    Sexually Abused: No      Family History  Problem Relation Age of Onset   Heart attack Father    Heart attack Brother       PHYSICAL EXAM: General:  Well appearing. No respiratory difficulty HEENT: normal Neck: supple. no JVD. Carotids 2+ bilat; no bruits. No lymphadenopathy or thyromegaly appreciated. Cor: PMI nondisplaced. Regular rate & rhythm. No rubs, gallops or murmurs. Lungs: clear Abdomen: soft, nontender, nondistended. No hepatosplenomegaly. No bruits or masses. Good bowel sounds. Extremities: no cyanosis, clubbing, rash, edema Neuro: alert & oriented x 3, cranial nerves grossly intact. moves all 4 extremities w/o difficulty. Affect pleasant.  ECG:   ASSESSMENT & PLAN:  1: Chronic heart failure with reduced ejection fraction- - suspect due to - NYHA class - euvolemic - weighing daily - Echo 03/23/21: EF 55-60% with Grade II DD, mildly elevated PA Pressure of 39.8 mmHg, mild MR, moderate TR - Echo 10/14/22: EF 30-35% with mild LVH/ LAE,moderate MR, severe TR - TEE 10/17/22: EF 30-35%, no atrial thrombus - continue  - BNP  2: HTN- - BP - saw PCP - BMP  3:  Atrial flutter-  4: Alcohol use-   Delma Freeze, FNP 10/23/22

## 2022-10-24 ENCOUNTER — Telehealth: Payer: Self-pay | Admitting: Family

## 2022-10-24 ENCOUNTER — Encounter: Payer: Medicare Other | Admitting: Family

## 2022-10-24 NOTE — Telephone Encounter (Signed)
Patient did not show for his initial Heart Failure Clinic appointment on 10/24/22.

## 2023-05-07 IMAGING — DX DG FOOT COMPLETE 3+V*R*
3 series · 3 of 3 positions shown · non-contrast
Comparison: None Available.

CLINICAL DATA: Pain for 10 years.

EXAM:
RIGHT FOOT COMPLETE - 3+ VIEW

[foot ap]
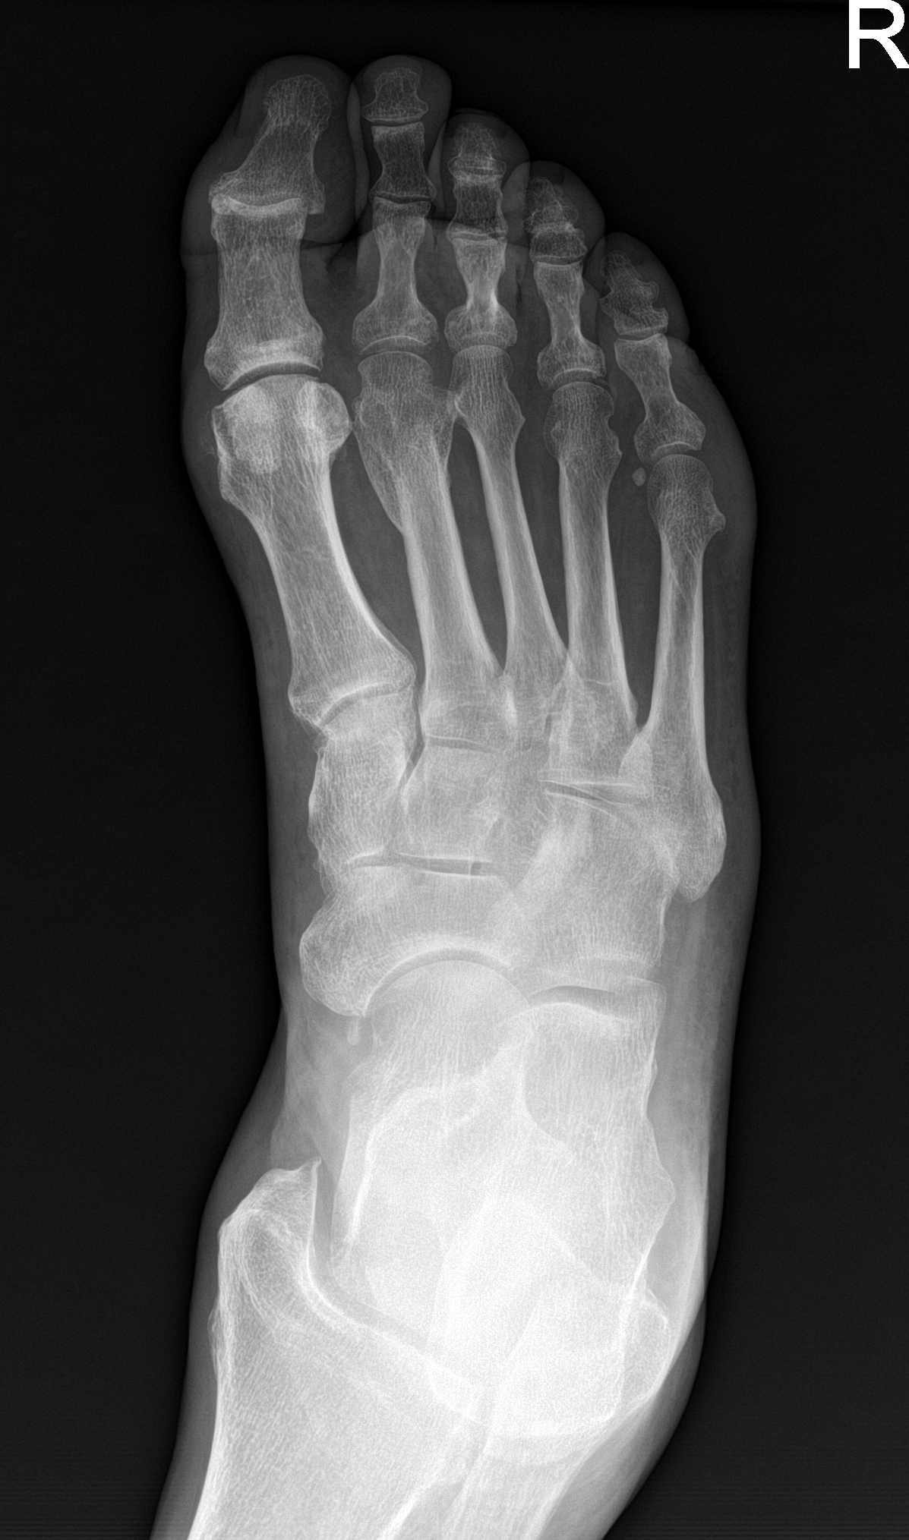

[foot obl]
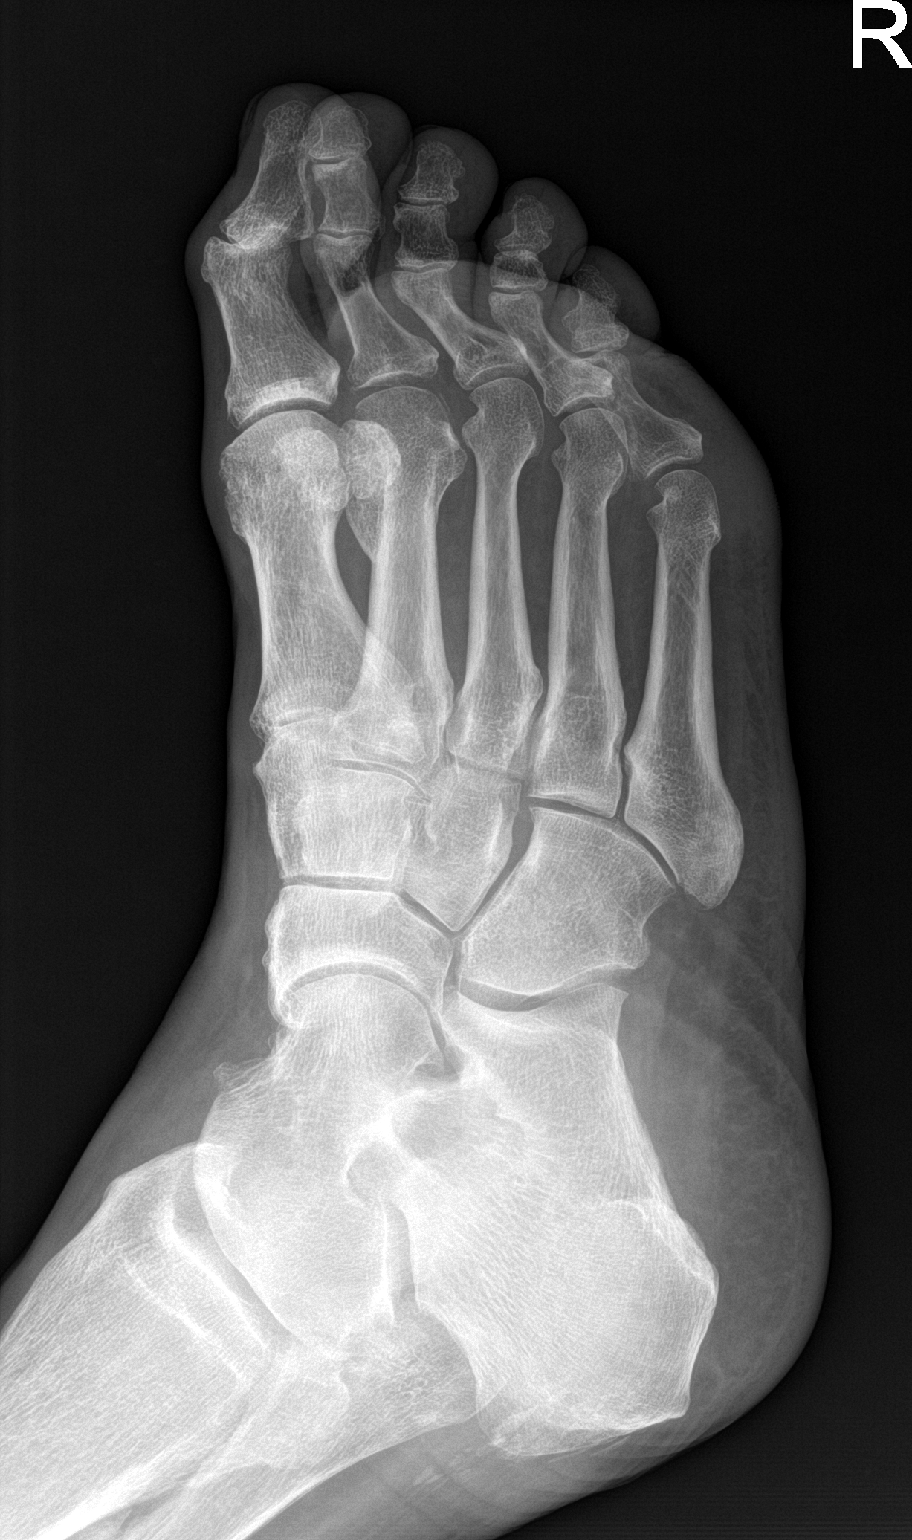

[foot lat]
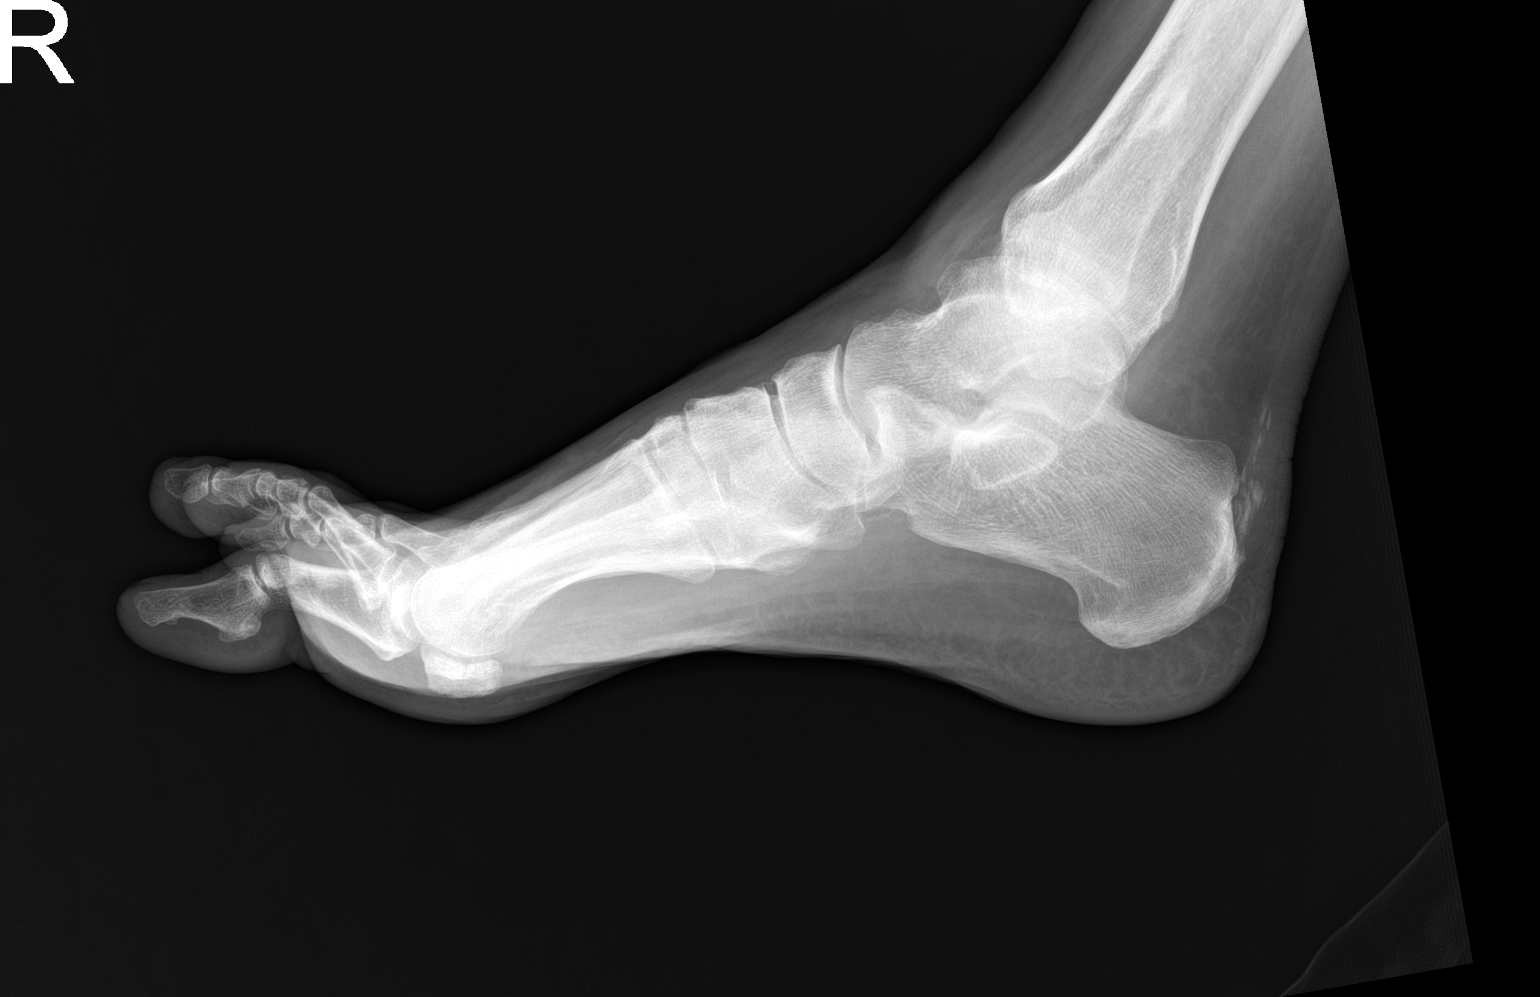

[3 of 3 positions shown; findings below may reference images not displayed]

FINDINGS: There is no evidence of fracture or dislocation. Mild arthropathy of
the interphalangeal joints. Achilles enthesopathy. Soft tissues are
unremarkable.
IMPRESSION: 1.  No acute fracture or dislocation.

2.  Mild arthritic changes.

3.  Achilles enthesopathy.
